# Patient Record
Sex: Male | Born: 1935
Health system: Southern US, Community
[De-identification: ages and names within clinical notes are randomized; demographics above are authoritative.]

## PROBLEM LIST (undated history)

## (undated) DIAGNOSIS — D689 Coagulation defect, unspecified: Secondary | ICD-10-CM

## (undated) DIAGNOSIS — E7800 Pure hypercholesterolemia, unspecified: Secondary | ICD-10-CM

## (undated) DIAGNOSIS — D649 Anemia, unspecified: Secondary | ICD-10-CM

## (undated) DIAGNOSIS — E559 Vitamin D deficiency, unspecified: Secondary | ICD-10-CM

## (undated) DIAGNOSIS — G459 Transient cerebral ischemic attack, unspecified: Secondary | ICD-10-CM

## (undated) DIAGNOSIS — D509 Iron deficiency anemia, unspecified: Secondary | ICD-10-CM

## (undated) DIAGNOSIS — N400 Enlarged prostate without lower urinary tract symptoms: Secondary | ICD-10-CM

## (undated) DIAGNOSIS — I480 Paroxysmal atrial fibrillation: Secondary | ICD-10-CM

## (undated) DIAGNOSIS — K219 Gastro-esophageal reflux disease without esophagitis: Secondary | ICD-10-CM

## (undated) HISTORY — DX: Iron deficiency anemia, unspecified: D50.9

## (undated) HISTORY — DX: Anemia, unspecified: D64.9

## (undated) HISTORY — DX: Coagulation defect, unspecified: D68.9

## (undated) HISTORY — DX: Gastro-esophageal reflux disease without esophagitis: K21.9

---

## 2011-04-03 DIAGNOSIS — I639 Cerebral infarction, unspecified: Secondary | ICD-10-CM

## 2011-04-03 HISTORY — DX: Cerebral infarction, unspecified: I63.9

## 2017-09-25 DIAGNOSIS — D61818 Other pancytopenia: Secondary | ICD-10-CM | POA: Diagnosis not present

## 2017-09-25 DIAGNOSIS — Z8673 Personal history of transient ischemic attack (TIA), and cerebral infarction without residual deficits: Secondary | ICD-10-CM | POA: Diagnosis not present

## 2017-09-25 DIAGNOSIS — E559 Vitamin D deficiency, unspecified: Secondary | ICD-10-CM | POA: Diagnosis not present

## 2017-09-25 DIAGNOSIS — I480 Paroxysmal atrial fibrillation: Secondary | ICD-10-CM | POA: Diagnosis not present

## 2017-09-25 DIAGNOSIS — N401 Enlarged prostate with lower urinary tract symptoms: Secondary | ICD-10-CM | POA: Diagnosis not present

## 2017-09-25 DIAGNOSIS — E7800 Pure hypercholesterolemia, unspecified: Secondary | ICD-10-CM | POA: Diagnosis not present

## 2017-11-07 DIAGNOSIS — E7800 Pure hypercholesterolemia, unspecified: Secondary | ICD-10-CM | POA: Diagnosis not present

## 2017-11-07 DIAGNOSIS — N401 Enlarged prostate with lower urinary tract symptoms: Secondary | ICD-10-CM | POA: Diagnosis not present

## 2017-11-07 DIAGNOSIS — Z8673 Personal history of transient ischemic attack (TIA), and cerebral infarction without residual deficits: Secondary | ICD-10-CM | POA: Diagnosis not present

## 2017-11-07 DIAGNOSIS — E559 Vitamin D deficiency, unspecified: Secondary | ICD-10-CM | POA: Diagnosis not present

## 2017-11-07 DIAGNOSIS — I480 Paroxysmal atrial fibrillation: Secondary | ICD-10-CM | POA: Diagnosis not present

## 2017-11-07 DIAGNOSIS — D508 Other iron deficiency anemias: Secondary | ICD-10-CM | POA: Diagnosis not present

## 2017-12-26 ENCOUNTER — Encounter (HOSPITAL_COMMUNITY): Payer: Self-pay | Admitting: Internal Medicine

## 2017-12-26 ENCOUNTER — Inpatient Hospital Stay (HOSPITAL_COMMUNITY): Payer: Medicare HMO

## 2017-12-26 ENCOUNTER — Inpatient Hospital Stay (HOSPITAL_COMMUNITY): Payer: Medicare HMO | Attending: Internal Medicine | Admitting: Internal Medicine

## 2017-12-26 VITALS — BP 125/74 | HR 103 | Temp 97.0°F | Resp 16 | Ht 67.0 in | Wt 156.9 lb

## 2017-12-26 DIAGNOSIS — Z7901 Long term (current) use of anticoagulants: Secondary | ICD-10-CM | POA: Diagnosis not present

## 2017-12-26 DIAGNOSIS — R5383 Other fatigue: Secondary | ICD-10-CM | POA: Diagnosis not present

## 2017-12-26 DIAGNOSIS — D508 Other iron deficiency anemias: Secondary | ICD-10-CM

## 2017-12-26 DIAGNOSIS — D72819 Decreased white blood cell count, unspecified: Secondary | ICD-10-CM

## 2017-12-26 DIAGNOSIS — D509 Iron deficiency anemia, unspecified: Secondary | ICD-10-CM | POA: Diagnosis not present

## 2017-12-26 DIAGNOSIS — Z8673 Personal history of transient ischemic attack (TIA), and cerebral infarction without residual deficits: Secondary | ICD-10-CM

## 2017-12-26 LAB — COMPREHENSIVE METABOLIC PANEL
ALT: 9 U/L (ref 0–44)
AST: 15 U/L (ref 15–41)
Albumin: 3.6 g/dL (ref 3.5–5.0)
Alkaline Phosphatase: 50 U/L (ref 38–126)
Anion gap: 6 (ref 5–15)
BILIRUBIN TOTAL: 0.5 mg/dL (ref 0.3–1.2)
BUN: 23 mg/dL (ref 8–23)
CALCIUM: 8.8 mg/dL — AB (ref 8.9–10.3)
CO2: 26 mmol/L (ref 22–32)
CREATININE: 0.89 mg/dL (ref 0.61–1.24)
Chloride: 106 mmol/L (ref 98–111)
GFR calc Af Amer: >60 mL/min (ref >60)
GFR calc non Af Amer: >60 mL/min (ref >60)
Glucose, Bld: 98 mg/dL (ref 70–99)
Potassium: 4.2 mmol/L (ref 3.5–5.1)
Sodium: 138 mmol/L (ref 135–145)
TOTAL PROTEIN: 6.7 g/dL (ref 6.5–8.1)

## 2017-12-26 LAB — CBC WITH DIFFERENTIAL/PLATELET
BASOS ABS: 0 10*3/uL (ref 0.0–0.1)
Basophils Relative: 0 %
Eosinophils Absolute: 0 10*3/uL (ref 0.0–0.7)
Eosinophils Relative: 1 %
HEMATOCRIT: 25.4 % — AB (ref 39.0–52.0)
Hemoglobin: 7.8 g/dL — ABNORMAL LOW (ref 13.0–17.0)
LYMPHS PCT: 25 %
Lymphs Abs: 0.8 10*3/uL (ref 0.7–4.0)
MCH: 24.5 pg — ABNORMAL LOW (ref 26.0–34.0)
MCHC: 30.7 g/dL (ref 30.0–36.0)
MCV: 79.9 fL (ref 78.0–100.0)
MONO ABS: 0.4 10*3/uL (ref 0.1–1.0)
Monocytes Relative: 15 %
NEUTROS ABS: 1.8 10*3/uL (ref 1.7–7.7)
Neutrophils Relative %: 59 %
Platelets: 157 10*3/uL (ref 150–400)
RBC: 3.18 MIL/uL — AB (ref 4.22–5.81)
RDW: 17.6 % — ABNORMAL HIGH (ref 11.5–15.5)
WBC: 3 10*3/uL — ABNORMAL LOW (ref 4.0–10.5)

## 2017-12-26 LAB — FOLATE: FOLATE: 12.5 ng/mL (ref >5.9)

## 2017-12-26 LAB — LACTATE DEHYDROGENASE: LDH: 164 U/L (ref 98–192)

## 2017-12-26 LAB — VITAMIN B12: Vitamin B-12: 246 pg/mL (ref 180–914)

## 2017-12-26 LAB — FERRITIN
FERRITIN: 5 ng/mL — AB (ref 24–336)
Ferritin: 5 ng/mL — ABNORMAL LOW (ref 24–336)

## 2017-12-26 NOTE — Patient Instructions (Signed)
[facility] Discharge Instructions  You saw Dr. Higgs today.   Thank you for choosing [facility] at [facility] to provide your oncology and hematology care.  To afford each patient quality time with our provider, please arrive at least 15 minutes before your scheduled appointment time.   If you have a lab appointment with the Cancer Center please come in thru the  Main Entrance and check in at the main information desk  You need to re-schedule your appointment should you arrive 10 or more minutes late.  We strive to give you quality time with our providers, and arriving late affects you and other patients whose appointments are after yours.  Also, if you no show three or more times for appointments you may be dismissed from the clinic at the providers discretion.     Again, thank you for choosing [facility].  Our hope is that these requests will decrease the amount of time that you wait before being seen by our physicians.       _____________________________________________________________  Should you have questions after your visit to [facility], please contact our office at [phone] between the hours of 8:00 a.m. and 4:30 p.m.  Voicemails left after 4:00 p.m. will not be returned until the following business day.  For prescription refill requests, have your pharmacy contact our office and allow 72 hours.    Cancer Center Support Programs:   > Cancer Support Group  2nd Tuesday of the month 1pm-2pm, Journey Room    

## 2017-12-26 NOTE — Progress Notes (Signed)
Referring Physician:  Vidal Schwalbe, MD  [facility]  Diagnosis Other iron deficiency anemia - Plan: CBC with Differential/Platelet, Comprehensive metabolic panel, Lactate dehydrogenase, Protein electrophoresis, serum, Ferritin, Hemoglobinopathy evaluation, Vitamin B12, Folate, Haptoglobin, CBC with Differential/Platelet, Ferritin, CANCELED: Comprehensive metabolic panel, CANCELED: Lactate dehydrogenase  Staging Cancer Staging No matching staging information was found for the patient.  Assessment and Plan:  1.  Iron deficiency anemia.  Labs performed today 12/26/2017 reviewed and showed a white count of 3 hemoglobin 7.8 hematocrit 25.4 MCV 79.9 platelets 157 iron is decreased at 5 B12 and folate are normal.  Chemistries within normal limits with a potassium of 4.2 creatinine 0.89 normal liver function tests.  SPEP, hemoglobin electrophoresis, haptoglobin are pending.  I discussed with the patient and his wife via phone that he was showing evidence of anemia with a hemoglobin of 7.8 and an iron level that was decreased at 5.  They were given option of blood transfusion or a trial of intravenous iron.  Patient and wife reports they do not desire blood transfusion or iron therapy.  They will try diet.  I have explained to them his blood counts are low and this could contribute to issues of shortness of breath, chest pain, worsening fatigue.  They are advised if he develops any symptoms he should report to the emergency room for evaluation.  Will determine if he has undergone GI evaluation in the past.  The patient will return to clinic in October 2019 for follow-up and repeat labs.  2.  Leukopenia.  Pt has normal differential.  Likely normal variant.  Will repeat labs on RTC.    3.  Stroke.  Patient reports this occurred in 2013 in 2015.  He is currently on Eliquis.  4.  Fatigue.  This is likely due to Iron deficiency anemia.  Pt was given option of blood transfusion or IV iron.  They desire  to try Diet only.  I have discussed with them potential risk for chest pain, SOB, worsening fatigue due to degree of anemia and IDA.   They are advised to report to ER if symptoms worsen.     Greater than 40 minutes spent with more than 50% spent in counseling and coordination of care.    HPI: 84 year old Adams referred for evaluation of anemia.  He reports he was tried on oral iron but had a reaction to the medications feeling dizzy and weak.  He has a history of a stroke in 2013 and 2015.  He denies any blood in his stool or his urine.  He has previously undergone colonoscopy evaluation.  Pt and Wife reports she would prefer for the patient to be treated with diet to improve his counts.  Patient is seen today for consultation due to anemia.  No labs available for review.  Problem List There are no active problems to display for this patient.   Past Medical History Past Medical History:  Diagnosis Date  . Anemia   . Clotting disorder ([place])   . GERD (gastroesophageal reflux disease)     Past Surgical History History reviewed. No pertinent surgical history.  Family History History reviewed. No pertinent family history.   Social History  reports that he has never smoked. He has never used smokeless tobacco. He reports that he does not drink alcohol or use drugs.  Medications  Current Outpatient Medications:  .  acetaminophen (TYLENOL) 500 MG tablet, Take 500 mg by mouth as needed for moderate pain., Disp: , Rfl:  .  Cholecalciferol (VITAMIN D3) 2000 units TABS, Take 1 tablet by mouth daily., Disp: , Rfl:  .  ELIQUIS 5 MG TABS tablet, , Disp: , Rfl:  .  ranitidine (ZANTAC) 150 MG tablet, , Disp: , Rfl:  .  tamsulosin (FLOMAX) 0.4 MG CAPS capsule, , Disp: , Rfl:   Allergies Patient has no known allergies.  Review of Systems Review of Systems - Oncology ROS negative other than weakness.     Physical Exam  Vitals Wt Readings from Last 3 Encounters:  12/26/17 156 lb 14.4 oz  (71.2 kg)   Temp Readings from Last 3 Encounters:  12/26/17 (!) 97 F (36.1 C) (Oral)   BP Readings from Last 3 Encounters:  12/26/17 125/74   Pulse Readings from Last 3 Encounters:  12/26/17 (!) 103   Constitutional: Well-developed, well-nourished, and in no distress.   HENT: Head: Normocephalic and atraumatic.  Mouth/Throat: No oropharyngeal exudate. Mucosa moist. Eyes: Pupils are equal, round, and reactive to light. Conjunctivae are normal. No scleral icterus.  Neck: Normal range of motion. Neck supple. No JVD present.  Cardiovascular: Normal rate, regular rhythm and normal heart sounds.  Exam reveals no gallop and no friction rub.   No murmur heard. Pulmonary/Chest: Effort normal and breath sounds normal. No respiratory distress. No wheezes.No rales.  Abdominal: Soft. Bowel sounds are normal. No distension. There is no tenderness. There is no guarding.  Musculoskeletal: No edema or tenderness.  Lymphadenopathy: No cervical, axillary or supraclavicular adenopathy.  Neurological: Alert and oriented to person, place, and time. No cranial nerve deficit.  Skin: Skin is warm and dry. No rash noted. No erythema. No pallor.  Psychiatric: Affect and judgment normal.   Labs Appointment on 12/26/2017  Component Date Value Ref Range Status  . WBC 12/26/2017 3.0* 4.0 - 10.5 K/uL Final  . RBC 12/26/2017 3.18* 4.22 - 5.81 MIL/uL Final  . Hemoglobin 12/26/2017 7.8* Nathan.0 - 17.0 g/dL Final  . HCT 12/26/2017 25.4* 39.0 - 52.0 % Final  . MCV 12/26/2017 79.9  78.0 - 100.0 fL Final  . MCH 12/26/2017 24.5* 26.0 - 34.0 pg Final  . MCHC 12/26/2017 30.7  30.0 - 36.0 g/dL Final  . RDW 12/26/2017 17.6* 11.5 - 15.5 % Final  . Platelets 12/26/2017 157  150 - 400 K/uL Final  . Neutrophils Relative % 12/26/2017 59  % Final  . Neutro Abs 12/26/2017 1.8  1.7 - 7.7 K/uL Final  . Lymphocytes Relative 12/26/2017 25  % Final  . Lymphs Abs 12/26/2017 0.8  0.7 - 4.0 K/uL Final  . Monocytes Relative 12/26/2017  15  % Final  . Monocytes Absolute 12/26/2017 0.4  0.1 - 1.0 K/uL Final  . Eosinophils Relative 12/26/2017 1  % Final  . Eosinophils Absolute 12/26/2017 0.0  0.0 - 0.7 K/uL Final  . Basophils Relative 12/26/2017 0  % Final  . Basophils Absolute 12/26/2017 0.0  0.0 - 0.1 K/uL Final   Performed at [facility], [address] [place], [place] [address]  . Sodium 12/26/2017 138  135 - 145 mmol/L Final  . Potassium 12/26/2017 4.2  3.5 - 5.1 mmol/L Final  . Chloride 12/26/2017 106  98 - 111 mmol/L Final  . CO2 12/26/2017 26  22 - 32 mmol/L Final  . Glucose, Bld 12/26/2017 98  70 - 99 mg/dL Final  . BUN 12/26/2017 23  8 - 23 mg/dL Final  . Creatinine, Ser 12/26/2017 0.89  0.61 - 1.24 mg/dL Final  . Calcium 12/26/2017 8.8* 8.9 - 10.3  mg/dL Final  . Total Protein 12/26/2017 6.7  6.5 - 8.1 g/dL Final  . Albumin 12/26/2017 3.6  3.5 - 5.0 g/dL Final  . AST 12/26/2017 15  15 - 41 U/L Final  . ALT 12/26/2017 9  0 - 44 U/L Final  . Alkaline Phosphatase 12/26/2017 50  38 - 126 U/L Final  . Total Bilirubin 12/26/2017 0.5  0.3 - 1.2 mg/dL Final  . GFR calc non Af Amer 12/26/2017 >60  >60 mL/min Final  . GFR calc Af Amer 12/26/2017 >60  >60 mL/min Final   Comment: (NOTE) The eGFR has been calculated using the CKD EPI equation. This calculation has not been validated in all clinical situations. eGFR's persistently <60 mL/min signify possible Chronic Kidney Disease.   Georgiann Hahn gap 12/26/2017 6  5 - 15 Final   Performed at [facility] [place], [address] [place], [place] [address]  . LDH 12/26/2017 164  98 - 192 U/L Final   Performed at [facility], [address], [place], [place] [address]  . Ferritin 12/26/2017 5* 24 - 336 ng/mL Final   Performed at [facility], [address] [place], [place] [address]  . Vitamin B-12 12/26/2017 246  180 - 914 pg/mL Final   Comment: (NOTE) This assay is not validated for testing neonatal or myeloproliferative syndrome specimens for Vitamin B12 levels. Performed at  [facility], [address] [place], [place] [address]   . Folate 12/26/2017 12.5  >5.9 ng/mL Final   Performed at [facility], [address], [place], [place] [address]  . Ferritin 12/26/2017 5* 24 - 336 ng/mL Final   Performed at [facility], [address] [place],  [address]     Pathology Orders Placed This Encounter  Procedures  . CBC with Differential/Platelet    Standing Status:   Future    Number of Occurrences:   1    Standing Expiration Date:   12/27/2018  . Comprehensive metabolic panel    Standing Status:   Future    Number of Occurrences:   1    Standing Expiration Date:   12/27/2018  . Lactate dehydrogenase    Standing Status:   Future    Number of Occurrences:   1    Standing Expiration Date:   12/27/2018  . Protein electrophoresis, serum    Standing Status:   Future    Number of Occurrences:   1    Standing Expiration Date:   12/27/2018  . Ferritin    Standing Status:   Future    Number of Occurrences:   1    Standing Expiration Date:   12/27/2018  . Hemoglobinopathy evaluation    Standing Status:   Future    Number of Occurrences:   1    Standing Expiration Date:   12/27/2018  . Vitamin B12    Standing Status:   Future    Number of Occurrences:   1    Standing Expiration Date:   12/27/2018  . Folate    Standing Status:   Future    Number of Occurrences:   1    Standing Expiration Date:   12/27/2018  . Haptoglobin    Standing Status:   Future    Number of Occurrences:   1    Standing Expiration Date:   12/27/2018  . CBC with Differential/Platelet    Standing Status:   Future    Number of Occurrences:   1    Standing Expiration Date:  12/27/2018  . Ferritin    Standing Status:   Future    Number of Occurrences:   1    Standing Expiration Date:   12/27/2018       Zoila Shutter MD

## 2017-12-27 LAB — HAPTOGLOBIN: HAPTOGLOBIN: 150 mg/dL (ref 34–200)

## 2017-12-30 LAB — HEMOGLOBINOPATHY EVALUATION
Hgb A2 Quant: 1.5 % — ABNORMAL LOW (ref 1.8–3.2)
Hgb A: 98.5 % (ref 96.4–98.8)
Hgb C: 0 %
Hgb F Quant: 0 % (ref 0.0–2.0)
Hgb S Quant: 0 %
Hgb Variant: 0 %

## 2017-12-30 LAB — PROTEIN ELECTROPHORESIS, SERUM
A/G RATIO SPE: 1.3 (ref 0.7–1.7)
ALBUMIN ELP: 3.6 g/dL (ref 2.9–4.4)
ALPHA-2-GLOBULIN: 0.6 g/dL (ref 0.4–1.0)
Alpha-1-Globulin: 0.2 g/dL (ref 0.0–0.4)
Beta Globulin: 0.8 g/dL (ref 0.7–1.3)
GLOBULIN, TOTAL: 2.7 g/dL (ref 2.2–3.9)
Gamma Globulin: 1 g/dL (ref 0.4–1.8)
Total Protein ELP: 6.3 g/dL (ref 6.0–8.5)

## 2018-01-14 ENCOUNTER — Other Ambulatory Visit (HOSPITAL_COMMUNITY): Payer: Self-pay | Admitting: *Deleted

## 2018-01-14 DIAGNOSIS — D508 Other iron deficiency anemias: Secondary | ICD-10-CM

## 2018-01-15 ENCOUNTER — Inpatient Hospital Stay (HOSPITAL_COMMUNITY): Payer: Medicare HMO | Attending: Hematology

## 2018-01-15 DIAGNOSIS — D509 Iron deficiency anemia, unspecified: Secondary | ICD-10-CM | POA: Insufficient documentation

## 2018-01-15 DIAGNOSIS — D72819 Decreased white blood cell count, unspecified: Secondary | ICD-10-CM | POA: Insufficient documentation

## 2018-01-15 DIAGNOSIS — R5383 Other fatigue: Secondary | ICD-10-CM | POA: Insufficient documentation

## 2018-01-15 DIAGNOSIS — D508 Other iron deficiency anemias: Secondary | ICD-10-CM

## 2018-01-15 DIAGNOSIS — Z8673 Personal history of transient ischemic attack (TIA), and cerebral infarction without residual deficits: Secondary | ICD-10-CM | POA: Diagnosis not present

## 2018-01-15 DIAGNOSIS — Z7901 Long term (current) use of anticoagulants: Secondary | ICD-10-CM | POA: Insufficient documentation

## 2018-01-15 LAB — CBC WITH DIFFERENTIAL/PLATELET
Abs Immature Granulocytes: 0.01 10*3/uL (ref 0.00–0.07)
BASOS ABS: 0 10*3/uL (ref 0.0–0.1)
Basophils Relative: 1 %
Eosinophils Absolute: 0 10*3/uL (ref 0.0–0.5)
Eosinophils Relative: 1 %
HCT: 27 % — ABNORMAL LOW (ref 39.0–52.0)
Hemoglobin: 8 g/dL — ABNORMAL LOW (ref 13.0–17.0)
Immature Granulocytes: 0 %
Lymphocytes Relative: 23 %
Lymphs Abs: 0.8 10*3/uL (ref 0.7–4.0)
MCH: 24 pg — AB (ref 26.0–34.0)
MCHC: 29.6 g/dL — ABNORMAL LOW (ref 30.0–36.0)
MCV: 81.1 fL (ref 80.0–100.0)
MONO ABS: 0.4 10*3/uL (ref 0.1–1.0)
Monocytes Relative: 11 %
Neutro Abs: 2.1 10*3/uL (ref 1.7–7.7)
Neutrophils Relative %: 64 %
Platelets: 150 10*3/uL (ref 150–400)
RBC: 3.33 MIL/uL — AB (ref 4.22–5.81)
RDW: 17.7 % — ABNORMAL HIGH (ref 11.5–15.5)
WBC: 3.4 10*3/uL — AB (ref 4.0–10.5)
nRBC: 0 % (ref 0.0–0.2)

## 2018-01-15 LAB — LACTATE DEHYDROGENASE: LDH: 164 U/L (ref 98–192)

## 2018-01-15 LAB — COMPREHENSIVE METABOLIC PANEL
ALBUMIN: 3.8 g/dL (ref 3.5–5.0)
ALK PHOS: 53 U/L (ref 38–126)
ALT: 11 U/L (ref 0–44)
AST: 16 U/L (ref 15–41)
Anion gap: 6 (ref 5–15)
BUN: 22 mg/dL (ref 8–23)
CALCIUM: 8.9 mg/dL (ref 8.9–10.3)
CO2: 25 mmol/L (ref 22–32)
CREATININE: 1 mg/dL (ref 0.61–1.24)
Chloride: 107 mmol/L (ref 98–111)
GFR calc Af Amer: >60 mL/min (ref >60)
GFR calc non Af Amer: >60 mL/min (ref >60)
GLUCOSE: 97 mg/dL (ref 70–99)
Potassium: 4 mmol/L (ref 3.5–5.1)
SODIUM: 138 mmol/L (ref 135–145)
Total Bilirubin: 0.4 mg/dL (ref 0.3–1.2)
Total Protein: 6.7 g/dL (ref 6.5–8.1)

## 2018-01-15 LAB — IRON AND TIBC
Iron: 20 ug/dL — ABNORMAL LOW (ref 45–182)
Saturation Ratios: 6 % — ABNORMAL LOW (ref 17.9–39.5)
TIBC: 356 ug/dL (ref 250–450)
UIBC: 336 ug/dL

## 2018-01-15 LAB — VITAMIN B12: VITAMIN B 12: 306 pg/mL (ref 180–914)

## 2018-01-15 LAB — FERRITIN: FERRITIN: 6 ng/mL — AB (ref 24–336)

## 2018-01-15 LAB — FOLATE: FOLATE: 18.6 ng/mL (ref >5.9)

## 2018-01-22 ENCOUNTER — Encounter (HOSPITAL_COMMUNITY): Payer: Self-pay | Admitting: Internal Medicine

## 2018-01-22 ENCOUNTER — Inpatient Hospital Stay (HOSPITAL_BASED_OUTPATIENT_CLINIC_OR_DEPARTMENT_OTHER): Payer: Medicare HMO | Admitting: Internal Medicine

## 2018-01-22 ENCOUNTER — Other Ambulatory Visit: Payer: Self-pay

## 2018-01-22 DIAGNOSIS — R5383 Other fatigue: Secondary | ICD-10-CM | POA: Diagnosis not present

## 2018-01-22 DIAGNOSIS — Z7901 Long term (current) use of anticoagulants: Secondary | ICD-10-CM

## 2018-01-22 DIAGNOSIS — D509 Iron deficiency anemia, unspecified: Secondary | ICD-10-CM | POA: Diagnosis not present

## 2018-01-22 DIAGNOSIS — Z8673 Personal history of transient ischemic attack (TIA), and cerebral infarction without residual deficits: Secondary | ICD-10-CM | POA: Diagnosis not present

## 2018-01-22 DIAGNOSIS — D72819 Decreased white blood cell count, unspecified: Secondary | ICD-10-CM | POA: Diagnosis not present

## 2018-01-22 DIAGNOSIS — D508 Other iron deficiency anemias: Secondary | ICD-10-CM

## 2018-01-22 HISTORY — DX: Iron deficiency anemia, unspecified: D50.9

## 2018-01-22 NOTE — Progress Notes (Signed)
Diagnosis Other iron deficiency anemia - Plan: CBC with Differential/Platelet, Comprehensive metabolic panel, Lactate dehydrogenase, Ferritin  Staging Cancer Staging No matching staging information was found for the patient.  Assessment and Plan:  1.  Iron deficiency anemia.  Labs performed 12/26/2017 reviewed and showed a white count of 3 hemoglobin 7.8 hematocrit 25.4 MCV 79.9 platelets 157 iron is decreased at 5 B12 and folate are normal.  Chemistries within normal limits with a potassium of 4.2 creatinine 0.89 normal liver function tests.  SPEP, hemoglobin electrophoresis, haptoglobin are pending.  Previously, I discussed with the patient and his wife via phone that he was showing evidence of anemia with a hemoglobin of 7.8 and an iron level that was decreased at 5.  They were given option of blood transfusion or a trial of intravenous iron.  Patient and wife reports they do not desire blood transfusion or iron therapy.  They will try diet.  I have explained to them his blood counts are low and this could contribute to issues of shortness of breath, chest pain, worsening fatigue.  They are advised if he develops any symptoms he should report to the emergency room for evaluation.    Labs done 01/15/2018 reviewed and showed WBC 3.4 HB 8 plts 150,000.  Chemistries WNL with K+ 4, Cr 1 and normal LFTS.  Ferritin remains decreased at 6.   Long talk with pt and wife today. Pt has an intolerance to oral iron.   I discussed with them numbers have not improved since last visit. I discussed with them option of Injectafer 750 mg IV D1 and D8.  Side effects of medication reviewed.  Wife was questioning if IV iron was a blood product.  I discussed with her Christeen Douglas is a medication.  Pt is agreeable to iron infusion.  He will RTC in 4-6 weeks for follow-up and repeat labs after IV iron.  Will determine if he has undergone GI evaluation in the past.    2.  Leukopenia.  WBC 3.4.  Pt has normal differential.   Likely normal variant.  Will repeat labs 4-6 weeks after IV iron.    3.  Stroke.  Patient reports this occurred in 2013 in 2015.  He is currently on Eliquis.  4.  Fatigue.  This is likely due to Iron deficiency anemia.  Pt was given option of blood transfusion or IV iron.  They initially desired to try Diet only.  I have discussed with them potential risk for chest pain, SOB, worsening fatigue due to degree of anemia and IDA.    Pt is now willing to receive IV iron.  Will determine if symptoms improve after IV iron.    5.  Request for prescription refills. Pt is followed by Dr. Marina Gravel and should follow-up with his office for medications. We are happy to contact their office for pt.    25 minutes spent with more than 50% spent in counseling and coordination of care.    Current Status:  Pt is seen today for follow-up.  Pt has been taking beet supplement.  Pt is here to go over labs.    Problem List Patient Active Problem List   Diagnosis Date Noted  . Iron deficiency anemia [D50.9] 01/22/2018    Past Medical History Past Medical History:  Diagnosis Date  . Anemia   . Clotting disorder ([place])   . GERD (gastroesophageal reflux disease)   . Iron deficiency anemia 01/22/2018    Past Surgical History History reviewed. No pertinent surgical  history.  Family History History reviewed. No pertinent family history.   Social History  reports that he has never smoked. He has never used smokeless tobacco. He reports that he does not drink alcohol or use drugs.  Medications  Current Outpatient Medications:  .  acetaminophen (TYLENOL) 500 MG tablet, Take 500 mg by mouth as needed for moderate pain., Disp: , Rfl:  .  Cholecalciferol (VITAMIN D3) 2000 units TABS, Take 1 tablet by mouth daily., Disp: , Rfl:  .  ELIQUIS 5 MG TABS tablet, , Disp: , Rfl:  .  OVER THE COUNTER MEDICATION, Take 1,000 mg by mouth 2 (two) times daily with a meal., Disp: , Rfl:  .  ranitidine (ZANTAC) 150 MG tablet,  , Disp: , Rfl:  .  tamsulosin (FLOMAX) 0.4 MG CAPS capsule, , Disp: , Rfl:   Allergies Patient has no known allergies.  Review of Systems Review of Systems - Oncology ROS negative other than fatigue   Physical Exam  Vitals Wt Readings from Last 3 Encounters:  12/26/17 156 lb 14.4 oz (71.2 kg)   Temp Readings from Last 3 Encounters:  01/22/18 (!) 97.5 F (36.4 C) (Oral)  12/26/17 (!) 97 F (36.1 C) (Oral)   BP Readings from Last 3 Encounters:  01/22/18 (!) 129/55  12/26/17 125/74   Pulse Readings from Last 3 Encounters:  01/22/18 (!) 54  12/26/17 (!) 103   Constitutional: Well-developed, well-nourished, and in no distress.   HENT: Head: Normocephalic and atraumatic.  Mouth/Throat: No oropharyngeal exudate. Mucosa moist. Eyes: Pupils are equal, round, and reactive to light. Conjunctivae are normal. No scleral icterus.  Neck: Normal range of motion. Neck supple. No JVD present.  Cardiovascular: Normal rate, regular rhythm and normal heart sounds.  Exam reveals no gallop and no friction rub.   No murmur heard. Pulmonary/Chest: Effort normal and breath sounds normal. No respiratory distress. No wheezes.No rales.  Abdominal: Soft. Bowel sounds are normal. No distension. There is no tenderness. There is no guarding.  Musculoskeletal: No edema or tenderness.  Lymphadenopathy: No cervical, axillary  or supraclavicular adenopathy.  Neurological: Alert and oriented to person, place, and time. No cranial nerve deficit.  Skin: Skin is warm and dry. No rash noted. No erythema. No pallor.  Psychiatric: Affect and judgment normal.   Labs No visits with results within 3 Day(s) from this visit.  Latest known visit with results is:  Appointment on 01/15/2018  Component Date Value Ref Range Status  . WBC 01/15/2018 3.4* 4.0 - 10.5 K/uL Final  . RBC 01/15/2018 3.33* 4.22 - 5.81 MIL/uL Final  . Hemoglobin 01/15/2018 8.0* 13.0 - 17.0 g/dL Final  . HCT 01/15/2018 27.0* 39.0 - 52.0 %  Final  . MCV 01/15/2018 81.1  80.0 - 100.0 fL Final  . MCH 01/15/2018 24.0* 26.0 - 34.0 pg Final  . MCHC 01/15/2018 29.6* 30.0 - 36.0 g/dL Final  . RDW 01/15/2018 17.7* 11.5 - 15.5 % Final  . Platelets 01/15/2018 150  150 - 400 K/uL Final  . nRBC 01/15/2018 0.0  0.0 - 0.2 % Final  . Neutrophils Relative % 01/15/2018 64  % Final  . Neutro Abs 01/15/2018 2.1  1.7 - 7.7 K/uL Final  . Lymphocytes Relative 01/15/2018 23  % Final  . Lymphs Abs 01/15/2018 0.8  0.7 - 4.0 K/uL Final  . Monocytes Relative 01/15/2018 11  % Final  . Monocytes Absolute 01/15/2018 0.4  0.1 - 1.0 K/uL Final  . Eosinophils Relative 01/15/2018 1  % Final  .  Eosinophils Absolute 01/15/2018 0.0  0.0 - 0.5 K/uL Final  . Basophils Relative 01/15/2018 1  % Final  . Basophils Absolute 01/15/2018 0.0  0.0 - 0.1 K/uL Final  . Immature Granulocytes 01/15/2018 0  % Final  . Abs Immature Granulocytes 01/15/2018 0.01  0.00 - 0.07 K/uL Final   Performed at [facility], [address] [place], [place] [address]  . Sodium 01/15/2018 138  135 - 145 mmol/L Final  . Potassium 01/15/2018 4.0  3.5 - 5.1 mmol/L Final  . Chloride 01/15/2018 107  98 - 111 mmol/L Final  . CO2 01/15/2018 25  22 - 32 mmol/L Final  . Glucose, Bld 01/15/2018 97  70 - 99 mg/dL Final  . BUN 01/15/2018 22  8 - 23 mg/dL Final  . Creatinine, Ser 01/15/2018 1.00  0.61 - 1.24 mg/dL Final  . Calcium 01/15/2018 8.9  8.9 - 10.3 mg/dL Final  . Total Protein 01/15/2018 6.7  6.5 - 8.1 g/dL Final  . Albumin 01/15/2018 3.8  3.5 - 5.0 g/dL Final  . AST 01/15/2018 16  15 - 41 U/L Final  . ALT 01/15/2018 11  0 - 44 U/L Final  . Alkaline Phosphatase 01/15/2018 53  38 - 126 U/L Final  . Total Bilirubin 01/15/2018 0.4  0.3 - 1.2 mg/dL Final  . GFR calc non Af Amer 01/15/2018 >60  >60 mL/min Final  . GFR calc Af Amer 01/15/2018 >60  >60 mL/min Final   Comment: (NOTE) The eGFR has been calculated using the CKD EPI equation. This calculation has not been validated in all  clinical situations. eGFR's persistently <60 mL/min signify possible Chronic Kidney Disease.   Georgiann Hahn gap 01/15/2018 6  5 - 15 Final   Performed at [facility], [address] [place], [place] [address]  . LDH 01/15/2018 164  98 - 192 U/L Final   Performed at [facility], [address], [place], [place] [address]  . Iron 01/15/2018 20* 45 - 182 ug/dL Final  . TIBC 01/15/2018 356  250 - 450 ug/dL Final  . Saturation Ratios 01/15/2018 6* 17.9 - 39.5 % Final  . UIBC 01/15/2018 336  ug/dL Final   Performed at [facility], [address], [place], [place] [address]  . Ferritin 01/15/2018 6* 24 - 336 ng/mL Final   Performed at [facility], [address] [place], [place] [address]  . Vitamin B-12 01/15/2018 306  180 - 914 pg/mL Final   Comment: (NOTE) This assay is not validated for testing neonatal or myeloproliferative syndrome specimens for Vitamin B12 levels. Performed at [facility], [address], [place], [place] [address]   . Folate 01/15/2018 18.6  >5.9 ng/mL Final   Performed at [facility], [address], [place], [place] [address]     Pathology Orders Placed This Encounter  Procedures  . CBC with Differential/Platelet    Standing Status:   Future    Standing Expiration Date:   01/23/2019  . Comprehensive metabolic panel    Standing Status:   Future    Standing Expiration Date:   01/23/2019  . Lactate dehydrogenase    Standing Status:   Future    Standing Expiration Date:   01/23/2019  . Ferritin    Standing Status:   Future    Standing Expiration Date:   01/23/2019       Zoila Shutter MD

## 2018-01-27 ENCOUNTER — Encounter (HOSPITAL_COMMUNITY): Payer: Self-pay

## 2018-01-27 ENCOUNTER — Inpatient Hospital Stay (HOSPITAL_COMMUNITY): Payer: Medicare HMO

## 2018-01-27 VITALS — BP 149/50 | HR 48 | Temp 97.6°F | Resp 18

## 2018-01-27 DIAGNOSIS — D508 Other iron deficiency anemias: Secondary | ICD-10-CM

## 2018-01-27 DIAGNOSIS — D509 Iron deficiency anemia, unspecified: Secondary | ICD-10-CM | POA: Diagnosis not present

## 2018-01-27 MED ORDER — SODIUM CHLORIDE 0.9 % IV SOLN
750.0000 mg | Freq: Once | INTRAVENOUS | Status: AC
  Administered 2018-01-27: 750 mg via INTRAVENOUS
  Filled 2018-01-27: qty 15

## 2018-01-27 MED ORDER — SODIUM CHLORIDE 0.9 % IV SOLN
Freq: Once | INTRAVENOUS | Status: AC
  Administered 2018-01-27: 13:00:00 via INTRAVENOUS

## 2018-01-27 NOTE — Patient Instructions (Signed)

## 2018-01-27 NOTE — Progress Notes (Signed)
Patient for iron treatment today.  Reviewed iron infusion with written information given with all questions asked and answered.  Family at side.    Patient tolerated iron infusion with no complaints voiced.  Good blood return noted before and after administration of iron.  Peripheral IV site clean and dry with no bruising or swelling noted at site.  Band aid applied.  VSS with discharge and left by wheelchair with no s/s of distress noted.

## 2018-02-03 ENCOUNTER — Ambulatory Visit (HOSPITAL_COMMUNITY): Payer: Medicare HMO

## 2018-02-04 ENCOUNTER — Encounter (HOSPITAL_COMMUNITY): Payer: Self-pay

## 2018-02-04 ENCOUNTER — Inpatient Hospital Stay (HOSPITAL_COMMUNITY): Payer: Medicare HMO | Attending: Internal Medicine

## 2018-02-04 VITALS — BP 144/42 | HR 110 | Temp 97.6°F | Resp 18

## 2018-02-04 DIAGNOSIS — D508 Other iron deficiency anemias: Secondary | ICD-10-CM

## 2018-02-04 DIAGNOSIS — D509 Iron deficiency anemia, unspecified: Secondary | ICD-10-CM | POA: Insufficient documentation

## 2018-02-04 MED ORDER — SODIUM CHLORIDE 0.9 % IV SOLN
750.0000 mg | Freq: Once | INTRAVENOUS | Status: AC
  Administered 2018-02-04: 750 mg via INTRAVENOUS
  Filled 2018-02-04: qty 15

## 2018-02-04 MED ORDER — SODIUM CHLORIDE 0.9 % IV SOLN
Freq: Once | INTRAVENOUS | Status: AC
  Administered 2018-02-04: 14:00:00 via INTRAVENOUS

## 2018-02-04 NOTE — Patient Instructions (Signed)
[facility] Discharge Instructions  Received Injectafer infusion today. Follow-up as scheduled. Call clinic for any questions or concerns   Thank you for choosing [facility] at [facility] to provide your oncology and hematology care.  To afford each patient quality time with our provider, please arrive at least 15 minutes before your scheduled appointment time.   If you have a lab appointment with the Cancer Center please come in thru the  Main Entrance and check in at the main information desk  You need to re-schedule your appointment should you arrive 10 or more minutes late.  We strive to give you quality time with our providers, and arriving late affects you and other patients whose appointments are after yours.  Also, if you no show three or more times for appointments you may be dismissed from the clinic at the providers discretion.     Again, thank you for choosing [facility].  Our hope is that these requests will decrease the amount of time that you wait before being seen by our physicians.       _____________________________________________________________  Should you have questions after your visit to [facility], please contact our office at [phone] between the hours of 8:00 a.m. and 4:30 p.m.  Voicemails left after 4:00 p.m. will not be returned until the following business day.  For prescription refill requests, have your pharmacy contact our office and allow 72 hours.    Cancer Center Support Programs:   > Cancer Support Group  2nd Tuesday of the month 1pm-2pm, Journey Room   

## 2018-02-04 NOTE — Progress Notes (Signed)
        Iron therapy administered today per MD orders. Tolerated infusion without adverse affects. Vital signs stable. No complaints at this time. Discharged from clinic via wheelchair. F/U with [facility] as scheduled.

## 2018-03-13 ENCOUNTER — Inpatient Hospital Stay (HOSPITAL_COMMUNITY): Payer: Medicare HMO | Attending: Hematology

## 2018-03-13 DIAGNOSIS — D509 Iron deficiency anemia, unspecified: Secondary | ICD-10-CM | POA: Diagnosis not present

## 2018-03-13 DIAGNOSIS — D508 Other iron deficiency anemias: Secondary | ICD-10-CM

## 2018-03-13 LAB — CBC WITH DIFFERENTIAL/PLATELET
ABS IMMATURE GRANULOCYTES: 0 10*3/uL (ref 0.00–0.07)
BASOS PCT: 1 %
Basophils Absolute: 0 10*3/uL (ref 0.0–0.1)
Eosinophils Absolute: 0 10*3/uL (ref 0.0–0.5)
Eosinophils Relative: 1 %
HCT: 34.1 % — ABNORMAL LOW (ref 39.0–52.0)
Hemoglobin: 11 g/dL — ABNORMAL LOW (ref 13.0–17.0)
Immature Granulocytes: 0 %
Lymphocytes Relative: 25 %
Lymphs Abs: 0.8 10*3/uL (ref 0.7–4.0)
MCH: 28.9 pg (ref 26.0–34.0)
MCHC: 32.3 g/dL (ref 30.0–36.0)
MCV: 89.5 fL (ref 80.0–100.0)
MONOS PCT: 16 %
Monocytes Absolute: 0.5 10*3/uL (ref 0.1–1.0)
NEUTROS ABS: 1.8 10*3/uL (ref 1.7–7.7)
NRBC: 0 % (ref 0.0–0.2)
Neutrophils Relative %: 57 %
PLATELETS: 91 10*3/uL — AB (ref 150–400)
RBC: 3.81 MIL/uL — ABNORMAL LOW (ref 4.22–5.81)
WBC: 3.2 10*3/uL — AB (ref 4.0–10.5)

## 2018-03-13 LAB — COMPREHENSIVE METABOLIC PANEL
ALK PHOS: 51 U/L (ref 38–126)
ALT: 11 U/L (ref 0–44)
ANION GAP: 4 — AB (ref 5–15)
AST: 17 U/L (ref 15–41)
Albumin: 3.5 g/dL (ref 3.5–5.0)
BILIRUBIN TOTAL: 0.4 mg/dL (ref 0.3–1.2)
BUN: 22 mg/dL (ref 8–23)
CALCIUM: 8.3 mg/dL — AB (ref 8.9–10.3)
CHLORIDE: 110 mmol/L (ref 98–111)
CO2: 25 mmol/L (ref 22–32)
Creatinine, Ser: 0.93 mg/dL (ref 0.61–1.24)
GFR calc non Af Amer: >60 mL/min (ref >60)
Glucose, Bld: 105 mg/dL — ABNORMAL HIGH (ref 70–99)
Potassium: 3.9 mmol/L (ref 3.5–5.1)
SODIUM: 139 mmol/L (ref 135–145)
TOTAL PROTEIN: 6.2 g/dL — AB (ref 6.5–8.1)

## 2018-03-13 LAB — FERRITIN: Ferritin: 199 ng/mL (ref 24–336)

## 2018-03-13 LAB — LACTATE DEHYDROGENASE: LDH: 160 U/L (ref 98–192)

## 2018-03-17 ENCOUNTER — Other Ambulatory Visit: Payer: Self-pay

## 2018-03-17 ENCOUNTER — Inpatient Hospital Stay (HOSPITAL_COMMUNITY): Payer: Medicare HMO | Attending: Internal Medicine | Admitting: Internal Medicine

## 2018-03-17 ENCOUNTER — Encounter (HOSPITAL_COMMUNITY): Payer: Self-pay | Admitting: Internal Medicine

## 2018-03-17 VITALS — BP 158/61 | HR 45 | Temp 97.5°F | Resp 18

## 2018-03-17 DIAGNOSIS — Z79899 Other long term (current) drug therapy: Secondary | ICD-10-CM | POA: Diagnosis not present

## 2018-03-17 DIAGNOSIS — Z7901 Long term (current) use of anticoagulants: Secondary | ICD-10-CM | POA: Diagnosis not present

## 2018-03-17 DIAGNOSIS — D696 Thrombocytopenia, unspecified: Secondary | ICD-10-CM | POA: Diagnosis present

## 2018-03-17 DIAGNOSIS — D509 Iron deficiency anemia, unspecified: Secondary | ICD-10-CM

## 2018-03-17 DIAGNOSIS — D72819 Decreased white blood cell count, unspecified: Secondary | ICD-10-CM

## 2018-03-17 NOTE — Patient Instructions (Signed)
[facility]  Discharge Instructions: You saw Dr. Higgs today                               _______________________________________________________________  Thank you for choosing [facility] at [facility] to provide your oncology and hematology care.  To afford each patient quality time with our providers, please arrive at least 15 minutes before your scheduled appointment.  You need to re-schedule your appointment if you arrive 10 or more minutes late.  We strive to give you quality time with our providers, and arriving late affects you and other patients whose appointments are after yours.  Also, if you no show three or more times for appointments you may be dismissed from the clinic.  Again, thank you for choosing [facility] at [facility]. Our hope is that these requests will allow you access to exceptional care and in a timely manner. _______________________________________________________________  If you have questions after your visit, please contact our office at [phone] between the hours of 8:30 a.m. and 5:00 p.m. Voicemails left after 4:30 p.m. will not be returned until the following business day. _______________________________________________________________  For prescription refill requests, have your pharmacy contact our office. _______________________________________________________________  Recommendations made by the consultant and any test results will be sent to your referring physician. _______________________________________________________________ 

## 2018-03-17 NOTE — Progress Notes (Signed)
Diagnosis Thrombocytopenia (HCC) - Plan: CBC with Differential/Platelet, Comprehensive metabolic panel, Lactate dehydrogenase  Staging Cancer Staging No matching staging information was found for the patient.  Assessment and Plan:  1.  Iron deficiency anemia.  Previously, I discussed with the patient and his wife via phone that he was showing evidence of anemia with a hemoglobin of 7.8 and an iron level that was decreased at 5.  They were given option of blood transfusion or a trial of intravenous iron.  Patient and wife reported at that time they did not desire blood transfusion or iron therapy.  They will try diet.  I have explained to them his blood counts are low and this could contribute to issues of shortness of breath, chest pain, worsening fatigue.  They are advised if he develops any symptoms he should report to the emergency room for evaluation.    Labs done 01/15/2018 reviewed and showed WBC 3.4 HB 8 plts 150,000.  Chemistries WNL with K+ 4, Cr 1 and normal LFTS.  Ferritin was decreased at 6. Pt has an intolerance to oral iron. He was eventually agreeable to IV iron and was last treated on 02/04/2018.  Labs done 03/13/2018 reviewed and showed WBC 3.2 HB 11 plts 91,000.  Ferritin 199.  HB and iron levels improved after IV iron.  Pt will have repeat labs in 04/2018 and follow-up at that time to go over results.    2.  Thrombocytopenia.  Plt count 91,000 on labs done 03/13/2018.  No significant abnormalities noted.  Previously, Plt count was 150,000 on labs done 12/2017.  Pt will have repeat labs in 04/2018 for follow-up.    3  Leukopenia.  WBC stable at 3.2.  Likely normal variant.  Will repeat labs in 04/2018.    4.  Stroke.  Patient reports this occurred in 2013 in 2015.  He is currently on Eliquis.  Current Status:  Pt is seen today for follow-up.  Pt is here to go over labs.    Problem List Patient Active Problem List   Diagnosis Date Noted  . Iron deficiency anemia [D50.9]  01/22/2018    Past Medical History Past Medical History:  Diagnosis Date  . Anemia   . Clotting disorder (HCC)   . GERD (gastroesophageal reflux disease)   . Iron deficiency anemia 01/22/2018    Past Surgical History History reviewed. No pertinent surgical history.  Family History History reviewed. No pertinent family history.   Social History  reports that he has never smoked. He has never used smokeless tobacco. He reports that he does not drink alcohol or use drugs.  Medications  Current Outpatient Medications:  .  acetaminophen (TYLENOL) 500 MG tablet, Take 500 mg by mouth as needed for moderate pain., Disp: , Rfl:  .  Cholecalciferol (VITAMIN D3) 2000 units TABS, Take 1 tablet by mouth daily., Disp: , Rfl:  .  ELIQUIS 5 MG TABS tablet, Take 5 mg by mouth 2 (two) times daily. , Disp: , Rfl:  .  ranitidine (ZANTAC) 150 MG tablet, Take 150 mg by mouth daily. , Disp: , Rfl:  .  tamsulosin (FLOMAX) 0.4 MG CAPS capsule, Take 0.4 mg by mouth at bedtime. , Disp: , Rfl:   Allergies Patient has no known allergies.  Review of Systems Review of Systems - Oncology ROS negative   Physical Exam  Vitals Wt Readings from Last 3 Encounters:  12/26/17 156 lb 14.4 oz (71.2 kg)   Temp Readings from Last 3 Encounters:  03/17/18 Marland Kitchen)  97.5 F (36.4 C) (Oral)  02/04/18 97.6 F (36.4 C) (Oral)  01/27/18 97.6 F (36.4 C) (Oral)   BP Readings from Last 3 Encounters:  03/17/18 (!) 158/61  02/04/18 (!) 144/42  01/27/18 (!) 149/50   Pulse Readings from Last 3 Encounters:  03/17/18 (!) 45  02/04/18 (!) 110  01/27/18 (!) 48   Constitutional: Well-developed, well-nourished, and in no distress.   HENT: Head: Normocephalic and atraumatic.  Mouth/Throat: No oropharyngeal exudate. Mucosa moist. Eyes: Pupils are equal, round, and reactive to light. Conjunctivae are normal. No scleral icterus.  Neck: Normal range of motion. Neck supple. No JVD present.  Cardiovascular: Normal rate,  regular rhythm and normal heart sounds.  Exam reveals no gallop and no friction rub.   No murmur heard. Pulmonary/Chest: Effort normal and breath sounds normal. No respiratory distress. No wheezes.No rales.  Abdominal: Soft. Bowel sounds are normal. No distension. There is no tenderness. There is no guarding.  Musculoskeletal: No edema or tenderness.  Lymphadenopathy: No cervical, axillary or supraclavicular adenopathy.  Neurological: Alert and oriented to person, place, and time. No cranial nerve deficit.  Skin: Skin is warm and dry. No rash noted. No erythema. No pallor.  Psychiatric: Affect and judgment normal.   Labs No visits with results within 3 Day(s) from this visit.  Latest known visit with results is:  Appointment on 03/13/2018  Component Date Value Ref Range Status  . WBC 03/13/2018 3.2* 4.0 - 10.5 K/uL Final  . RBC 03/13/2018 3.81* 4.22 - 5.81 MIL/uL Final  . Hemoglobin 03/13/2018 11.0* 13.0 - 17.0 g/dL Final  . HCT 40/98/119112/03/2018 34.1* 39.0 - 52.0 % Final  . MCV 03/13/2018 89.5  80.0 - 100.0 fL Final  . MCH 03/13/2018 28.9  26.0 - 34.0 pg Final  . MCHC 03/13/2018 32.3  30.0 - 36.0 g/dL Final  . RDW 47/82/956212/03/2018 Not Measured  11.5 - 15.5 % Final  . Platelets 03/13/2018 91* 150 - 400 K/uL Final   Comment: PLATELET COUNT CONFIRMED BY SMEAR SPECIMEN CHECKED FOR CLOTS Immature Platelet Fraction may be clinically indicated, consider ordering this additional test [id number]   . nRBC 03/13/2018 0.0  0.0 - 0.2 % Final  . Neutrophils Relative % 03/13/2018 57  % Final  . Neutro Abs 03/13/2018 1.8  1.7 - 7.7 K/uL Final  . Lymphocytes Relative 03/13/2018 25  % Final  . Lymphs Abs 03/13/2018 0.8  0.7 - 4.0 K/uL Final  . Monocytes Relative 03/13/2018 16  % Final  . Monocytes Absolute 03/13/2018 0.5  0.1 - 1.0 K/uL Final  . Eosinophils Relative 03/13/2018 1  % Final  . Eosinophils Absolute 03/13/2018 0.0  0.0 - 0.5 K/uL Final  . Basophils Relative 03/13/2018 1  % Final  . Basophils  Absolute 03/13/2018 0.0  0.0 - 0.1 K/uL Final  . Immature Granulocytes 03/13/2018 0  % Final  . Abs Immature Granulocytes 03/13/2018 0.00  0.00 - 0.07 K/uL Final  . Schistocytes 03/13/2018 PRESENT   Final  . Dimorphism 03/13/2018 PRESENT   Final   Performed at [facility], [address] [place], [place] [address]  . Sodium 03/13/2018 139  135 - 145 mmol/L Final  . Potassium 03/13/2018 3.9  3.5 - 5.1 mmol/L Final  . Chloride 03/13/2018 110  98 - 111 mmol/L Final  . CO2 03/13/2018 25  22 - 32 mmol/L Final  . Glucose, Bld 03/13/2018 105* 70 - 99 mg/dL Final  . BUN 95/28/413212/03/2018 22  8 - 23 mg/dL Final  .  Creatinine, Ser 03/13/2018 0.93  0.61 - 1.24 mg/dL Final  . Calcium 16/01/9603 8.3* 8.9 - 10.3 mg/dL Final  . Total Protein 03/13/2018 6.2* 6.5 - 8.1 g/dL Final  . Albumin 54/12/8117 3.5  3.5 - 5.0 g/dL Final  . AST 14/78/2956 17  15 - 41 U/L Final  . ALT 03/13/2018 11  0 - 44 U/L Final  . Alkaline Phosphatase 03/13/2018 51  38 - 126 U/L Final  . Total Bilirubin 03/13/2018 0.4  0.3 - 1.2 mg/dL Final  . GFR calc non Af Amer 03/13/2018 >60  >60 mL/min Final  . GFR calc Af Amer 03/13/2018 >60  >60 mL/min Final  . Anion gap 03/13/2018 4* 5 - 15 Final   Performed at [facility], [address] [place], [place] [address]  . LDH 03/13/2018 160  98 - 192 U/L Final   Performed at [facility], [address], [place], [place] [address]  . Ferritin 03/13/2018 199  24 - 336 ng/mL Final   Performed at [facility], [address], [place], [place] [address]     Pathology Orders Placed This Encounter  Procedures  . CBC with Differential/Platelet    Standing Status:   Future    Standing Expiration Date:   03/17/2020  . Comprehensive metabolic panel    Standing Status:   Future    Standing Expiration Date:   03/17/2020  . Lactate dehydrogenase    Standing Status:   Future    Standing Expiration Date:   03/17/2020       Ahmed Prima MD

## 2018-04-23 ENCOUNTER — Inpatient Hospital Stay (HOSPITAL_COMMUNITY): Payer: Medicare HMO | Attending: Internal Medicine | Admitting: Internal Medicine

## 2018-04-23 ENCOUNTER — Inpatient Hospital Stay (HOSPITAL_COMMUNITY): Payer: Medicare HMO

## 2018-04-23 ENCOUNTER — Encounter (HOSPITAL_COMMUNITY): Payer: Self-pay | Admitting: Internal Medicine

## 2018-04-23 VITALS — BP 131/48 | HR 53 | Temp 97.6°F | Resp 18 | Wt 153.0 lb

## 2018-04-23 DIAGNOSIS — D696 Thrombocytopenia, unspecified: Secondary | ICD-10-CM | POA: Diagnosis not present

## 2018-04-23 DIAGNOSIS — D509 Iron deficiency anemia, unspecified: Secondary | ICD-10-CM | POA: Insufficient documentation

## 2018-04-23 DIAGNOSIS — Z79899 Other long term (current) drug therapy: Secondary | ICD-10-CM

## 2018-04-23 DIAGNOSIS — Z8673 Personal history of transient ischemic attack (TIA), and cerebral infarction without residual deficits: Secondary | ICD-10-CM | POA: Diagnosis not present

## 2018-04-23 DIAGNOSIS — D508 Other iron deficiency anemias: Secondary | ICD-10-CM

## 2018-04-23 DIAGNOSIS — D72819 Decreased white blood cell count, unspecified: Secondary | ICD-10-CM | POA: Diagnosis not present

## 2018-04-23 DIAGNOSIS — K5900 Constipation, unspecified: Secondary | ICD-10-CM | POA: Diagnosis not present

## 2018-04-23 DIAGNOSIS — Z7901 Long term (current) use of anticoagulants: Secondary | ICD-10-CM

## 2018-04-23 LAB — COMPREHENSIVE METABOLIC PANEL
ALBUMIN: 3.5 g/dL (ref 3.5–5.0)
ALT: 12 U/L (ref 0–44)
ANION GAP: 7 (ref 5–15)
AST: 17 U/L (ref 15–41)
Alkaline Phosphatase: 52 U/L (ref 38–126)
BUN: 23 mg/dL (ref 8–23)
CO2: 26 mmol/L (ref 22–32)
Calcium: 8.6 mg/dL — ABNORMAL LOW (ref 8.9–10.3)
Chloride: 105 mmol/L (ref 98–111)
Creatinine, Ser: 0.91 mg/dL (ref 0.61–1.24)
GFR calc Af Amer: >60 mL/min (ref >60)
GFR calc non Af Amer: >60 mL/min (ref >60)
GLUCOSE: 93 mg/dL (ref 70–99)
POTASSIUM: 3.7 mmol/L (ref 3.5–5.1)
Sodium: 138 mmol/L (ref 135–145)
Total Bilirubin: 0.3 mg/dL (ref 0.3–1.2)
Total Protein: 6.4 g/dL — ABNORMAL LOW (ref 6.5–8.1)

## 2018-04-23 LAB — CBC WITH DIFFERENTIAL/PLATELET
Abs Immature Granulocytes: 0.01 10*3/uL (ref 0.00–0.07)
Basophils Absolute: 0 10*3/uL (ref 0.0–0.1)
Basophils Relative: 0 %
EOS ABS: 0.1 10*3/uL (ref 0.0–0.5)
Eosinophils Relative: 2 %
HCT: 35.2 % — ABNORMAL LOW (ref 39.0–52.0)
Hemoglobin: 11.2 g/dL — ABNORMAL LOW (ref 13.0–17.0)
Immature Granulocytes: 0 %
Lymphocytes Relative: 21 %
Lymphs Abs: 0.7 10*3/uL (ref 0.7–4.0)
MCH: 30 pg (ref 26.0–34.0)
MCHC: 31.8 g/dL (ref 30.0–36.0)
MCV: 94.4 fL (ref 80.0–100.0)
Monocytes Absolute: 0.3 10*3/uL (ref 0.1–1.0)
Monocytes Relative: 9 %
NEUTROS PCT: 68 %
Neutro Abs: 2.2 10*3/uL (ref 1.7–7.7)
Platelets: 96 10*3/uL — ABNORMAL LOW (ref 150–400)
RBC: 3.73 MIL/uL — ABNORMAL LOW (ref 4.22–5.81)
RDW: 18.1 % — ABNORMAL HIGH (ref 11.5–15.5)
WBC: 3.2 10*3/uL — ABNORMAL LOW (ref 4.0–10.5)
nRBC: 0 % (ref 0.0–0.2)

## 2018-04-23 LAB — PROTIME-INR
INR: 1.22
Prothrombin Time: 15.3 seconds — ABNORMAL HIGH (ref 11.4–15.2)

## 2018-04-23 LAB — LACTATE DEHYDROGENASE: LDH: 157 U/L (ref 98–192)

## 2018-04-23 LAB — APTT: APTT: 33 s (ref 24–36)

## 2018-04-23 NOTE — Patient Instructions (Signed)
[facility] at [facility] Discharge Instructions  You were seen by Dr. Higgs today   Thank you for choosing [facility] at [facility] to provide your oncology and hematology care.  To afford each patient quality time with our provider, please arrive at least 15 minutes before your scheduled appointment time.   If you have a lab appointment with the Cancer Center please come in thru the  Main Entrance and check in at the main information desk  You need to re-schedule your appointment should you arrive 10 or more minutes late.  We strive to give you quality time with our providers, and arriving late affects you and other patients whose appointments are after yours.  Also, if you no show three or more times for appointments you may be dismissed from the clinic at the providers discretion.     Again, thank you for choosing [facility].  Our hope is that these requests will decrease the amount of time that you wait before being seen by our physicians.       _____________________________________________________________  Should you have questions after your visit to [facility], please contact our office at [phone] between the hours of 8:00 a.m. and 4:30 p.m.  Voicemails left after 4:00 p.m. will not be returned until the following business day.  For prescription refill requests, have your pharmacy contact our office and allow 72 hours.    Cancer Center Support Programs:   > Cancer Support Group  2nd Tuesday of the month 1pm-2pm, Journey Room   

## 2018-04-23 NOTE — Progress Notes (Signed)
Diagnosis Thrombocytopenia (HCC) - Plan: Hepatitis B surface antibody, Hepatitis B surface antigen, Hepatitis B core antibody, total, Hepatitis panel, acute, Hepatitis C RNA quantitative, HIV Antibody (routine testing w rflx), Protime-INR, APTT, Platelet Count on Citrated Bld, CBC with Differential/Platelet, Comprehensive metabolic panel, Lactate dehydrogenase, Ferritin  Other iron deficiency anemia - Plan: CBC with Differential/Platelet, Comprehensive metabolic panel, Lactate dehydrogenase, Ferritin  Staging Cancer Staging No matching staging information was found for the patient.  Assessment and Plan:  1.  Iron deficiency anemia.  Previously, I discussed with the patient and his wife via phone that he was showing evidence of anemia with a hemoglobin of 7.8 and an iron level that was decreased at 5.  They were given option of blood transfusion or a trial of intravenous iron.  Patient and wife reported at that time they did not desire blood transfusion or iron therapy.  They will try diet.  I explained to them his blood counts are low and this could contribute to issues of shortness of breath, chest pain, worsening fatigue.  They are advised if he develops any symptoms he should report to the emergency room for evaluation.    Labs done 01/15/2018 showed WBC 3.4 HB 8 plts 150,000.  Chemistries WNL with K+ 4, Cr 1 and normal LFTS.  Ferritin was decreased at 6. Pt has an intolerance to oral iron. He was eventually agreeable to IV iron and was last treated on 02/04/2018.  HB and iron levels improved after IV iron.    Labs repeated today 04/23/2018 reviewed and showed WBC 3.2 HB 11.2 plts 96,000.  Chemistries WNL with K+ 3.7 Cr 0.91 and normal LFTs.  HB remains improved after IV iron.  He will have repeat labs in 05/2018 and will follow-up to go over resuts.    2.  Thrombocytopenia.  Have spoken with hematology today.  Plt count 91,000 on labs done 03/13/2018.  Smear shows some clumping and fragmentation.   LDH normal at 157.  He has normal Cr of 0.91. PT 15.3 and PTT 33.   Previously, Plt count was 150,000 on labs done 12/2017.  Will check Hep panel, HIV, and plt count in citrated tube.  Pt denies any new medications other than famotidine.  Pending remaining lab results may consider imaging to evaluate liver and spleen.    3  Leukopenia.  WBC stable at 3.2.  Likely normal variant.  Will repeat labs in 05/2018.    4.  Stroke.  Patient reports this occurred in 2013.  He is currently on Eliquis.  5.  Constipation.  Stool softeners recommended.  If no improvement will consider imaging due to IDA history.    25 minutes spent with more than 50% spent in counseling and coordination of care.    Current Status:  Pt is seen today for follow-up.  Pt is here to go over labs.  He reports constipation.    Problem List Patient Active Problem List   Diagnosis Date Noted  . Iron deficiency anemia [D50.9] 01/22/2018    Past Medical History Past Medical History:  Diagnosis Date  . Anemia   . Clotting disorder (HCC)   . GERD (gastroesophageal reflux disease)   . Iron deficiency anemia 01/22/2018    Past Surgical History History reviewed. No pertinent surgical history.  Family History History reviewed. No pertinent family history.   Social History  reports that he has never smoked. He has never used smokeless tobacco. He reports that he does not drink alcohol or use drugs.  Medications  Current Outpatient Medications:  .  acetaminophen (TYLENOL) 500 MG tablet, Take 500 mg by mouth as needed for moderate pain., Disp: , Rfl:  .  Cholecalciferol (VITAMIN D3) 2000 units TABS, Take 1 tablet by mouth daily., Disp: , Rfl:  .  ELIQUIS 5 MG TABS tablet, Take 5 mg by mouth 2 (two) times daily. , Disp: , Rfl:  .  ranitidine (ZANTAC) 150 MG tablet, Take 150 mg by mouth daily. , Disp: , Rfl:  .  tamsulosin (FLOMAX) 0.4 MG CAPS capsule, Take 0.4 mg by mouth at bedtime. , Disp: , Rfl:   Allergies Patient has  no known allergies.  Review of Systems Review of Systems - Oncology ROS negative other than constipation   Physical Exam  Vitals Wt Readings from Last 3 Encounters:  04/23/18 153 lb (69.4 kg)  12/26/17 156 lb 14.4 oz (71.2 kg)   Temp Readings from Last 3 Encounters:  04/23/18 97.6 F (36.4 C) (Oral)  03/17/18 (!) 97.5 F (36.4 C) (Oral)  02/04/18 97.6 F (36.4 C) (Oral)   BP Readings from Last 3 Encounters:  04/23/18 (!) 131/48  03/17/18 (!) 158/61  02/04/18 (!) 144/42   Pulse Readings from Last 3 Encounters:  04/23/18 (!) 53  03/17/18 (!) 45  02/04/18 (!) 110   Constitutional: Well-developed, well-nourished, and in no distress.   HENT: Head: Normocephalic and atraumatic.  Mouth/Throat: No oropharyngeal exudate. Mucosa moist. Eyes: Pupils are equal, round, and reactive to light. Conjunctivae are normal. No scleral icterus.  Neck: Normal range of motion. Neck supple. No JVD present.  Cardiovascular: Normal rate, regular rhythm and normal heart sounds.  Exam reveals no gallop and no friction rub.   No murmur heard. Pulmonary/Chest: Effort normal and breath sounds normal. No respiratory distress. No wheezes.No rales.  Abdominal: Soft. Bowel sounds are normal. No distension. There is no tenderness. There is no guarding.  Musculoskeletal: No edema or tenderness.  Lymphadenopathy: No cervical, axillary or supraclavicular adenopathy.  Neurological: Alert and oriented to person, place, and time. No cranial nerve deficit.  Skin: Skin is warm and dry. No rash noted. No erythema. No pallor.  Psychiatric: Affect and judgment normal.   Labs Appointment on 04/23/2018  Component Date Value Ref Range Status  . WBC 04/23/2018 3.2* 4.0 - 10.5 K/uL Final  . RBC 04/23/2018 3.73* 4.22 - 5.81 MIL/uL Final  . Hemoglobin 04/23/2018 11.2* 13.0 - 17.0 g/dL Final  . HCT 16/10/960401/22/2020 35.2* 39.0 - 52.0 % Final  . MCV 04/23/2018 94.4  80.0 - 100.0 fL Final  . MCH 04/23/2018 30.0  26.0 - 34.0  pg Final  . MCHC 04/23/2018 31.8  30.0 - 36.0 g/dL Final  . RDW 54/09/811901/22/2020 18.1* 11.5 - 15.5 % Final  . Platelets 04/23/2018 96* 150 - 400 K/uL Final   Comment: SPECIMEN CHECKED FOR CLOTS Immature Platelet Fraction may be clinically indicated, consider ordering this additional test [id number] CONSISTENT WITH PREVIOUS RESULT   . nRBC 04/23/2018 0.0  0.0 - 0.2 % Final  . Neutrophils Relative % 04/23/2018 68  % Final  . Neutro Abs 04/23/2018 2.2  1.7 - 7.7 K/uL Final  . Lymphocytes Relative 04/23/2018 21  % Final  . Lymphs Abs 04/23/2018 0.7  0.7 - 4.0 K/uL Final  . Monocytes Relative 04/23/2018 9  % Final  . Monocytes Absolute 04/23/2018 0.3  0.1 - 1.0 K/uL Final  . Eosinophils Relative 04/23/2018 2  % Final  . Eosinophils Absolute 04/23/2018 0.1  0.0 - 0.5 K/uL  Final  . Basophils Relative 04/23/2018 0  % Final  . Basophils Absolute 04/23/2018 0.0  0.0 - 0.1 K/uL Final  . Immature Granulocytes 04/23/2018 0  % Final  . Abs Immature Granulocytes 04/23/2018 0.01  0.00 - 0.07 K/uL Final   Performed at [facility], [address] [place], [place] [address]  . Sodium 04/23/2018 138  135 - 145 mmol/L Final  . Potassium 04/23/2018 3.7  3.5 - 5.1 mmol/L Final  . Chloride 04/23/2018 105  98 - 111 mmol/L Final  . CO2 04/23/2018 26  22 - 32 mmol/L Final  . Glucose, Bld 04/23/2018 93  70 - 99 mg/dL Final  . BUN 60/45/409801/22/2020 23  8 - 23 mg/dL Final  . Creatinine, Ser 04/23/2018 0.91  0.61 - 1.24 mg/dL Final  . Calcium 11/91/478201/22/2020 8.6* 8.9 - 10.3 mg/dL Final  . Total Protein 04/23/2018 6.4* 6.5 - 8.1 g/dL Final  . Albumin 95/62/130801/22/2020 3.5  3.5 - 5.0 g/dL Final  . AST 65/78/469601/22/2020 17  15 - 41 U/L Final  . ALT 04/23/2018 12  0 - 44 U/L Final  . Alkaline Phosphatase 04/23/2018 52  38 - 126 U/L Final  . Total Bilirubin 04/23/2018 0.3  0.3 - 1.2 mg/dL Final  . GFR calc non Af Amer 04/23/2018 >60  >60 mL/min Final  . GFR calc Af Amer 04/23/2018 >60  >60 mL/min Final  . Anion gap 04/23/2018 7  5 - 15 Final    Performed at [facility], [address] [place], [place] [address]  . LDH 04/23/2018 157  98 - 192 U/L Final   Performed at [facility], [address] [place], [place] [address]  . Prothrombin Time 04/23/2018 15.3* 11.4 - 15.2 seconds Final  . INR 04/23/2018 1.22   Final   Performed at [facility], [address] [place], [place] [address]  . aPTT 04/23/2018 33  24 - 36 seconds Final   Performed at [facility], [address] [place], [place] [address]     Pathology Orders Placed This Encounter  Procedures  . Hepatitis B surface antibody    Standing Status:   Future    Number of Occurrences:   1    Standing Expiration Date:   04/24/2019  . Hepatitis B surface antigen    Standing Status:   Future    Number of Occurrences:   1    Standing Expiration Date:   04/24/2019  . Hepatitis B core antibody, total    Standing Status:   Future    Number of Occurrences:   1    Standing Expiration Date:   04/24/2019  . Hepatitis panel, acute    Standing Status:   Future    Number of Occurrences:   1    Standing Expiration Date:   04/24/2019  . Hepatitis C RNA quantitative    Standing Status:   Future    Number of Occurrences:   1    Standing Expiration Date:   04/24/2019  . HIV Antibody (routine testing w rflx)    Standing Status:   Future    Number of Occurrences:   1    Standing Expiration Date:   04/24/2019  . Protime-INR    Standing Status:   Future    Number of Occurrences:   1    Standing Expiration Date:   04/24/2019  . APTT    Standing Status:   Future    Number of Occurrences:   1  Standing Expiration Date:   04/24/2019  . Platelet Count on Citrated Bld    Standing Status:   Future    Number of Occurrences:   1    Standing Expiration Date:   04/24/2019  . CBC with Differential/Platelet    Standing Status:   Future    Standing Expiration Date:   04/24/2019  . Comprehensive metabolic panel    Standing Status:   Future    Standing Expiration Date:   04/24/2019  . Lactate  dehydrogenase    Standing Status:   Future    Standing Expiration Date:   04/24/2019  . Ferritin    Standing Status:   Future    Standing Expiration Date:   04/24/2019       Ahmed Prima MD

## 2018-04-24 LAB — HEPATITIS PANEL, ACUTE
HCV Ab: 0.2 s/co ratio (ref 0.0–0.9)
Hep A IgM: NEGATIVE
Hep B C IgM: NEGATIVE
Hepatitis B Surface Ag: NEGATIVE

## 2018-04-24 LAB — HEPATITIS B SURFACE ANTIBODY,QUALITATIVE: Hep B S Ab: REACTIVE

## 2018-04-24 LAB — HEPATITIS B SURFACE ANTIGEN: Hepatitis B Surface Ag: NEGATIVE

## 2018-04-24 LAB — HEPATITIS B CORE ANTIBODY, TOTAL: HEP B C TOTAL AB: NEGATIVE

## 2018-04-25 LAB — HIV ANTIBODY (ROUTINE TESTING W REFLEX): HIV Screen 4th Generation wRfx: NONREACTIVE

## 2018-05-21 ENCOUNTER — Encounter (HOSPITAL_COMMUNITY): Payer: Self-pay | Admitting: Internal Medicine

## 2018-05-21 ENCOUNTER — Inpatient Hospital Stay (HOSPITAL_COMMUNITY): Payer: Medicare HMO | Admitting: Internal Medicine

## 2018-05-21 ENCOUNTER — Inpatient Hospital Stay (HOSPITAL_COMMUNITY): Payer: Medicare HMO | Attending: Hematology

## 2018-05-21 ENCOUNTER — Other Ambulatory Visit (HOSPITAL_COMMUNITY): Payer: Self-pay | Admitting: Internal Medicine

## 2018-05-21 VITALS — BP 142/43 | HR 73 | Resp 18 | Wt 174.0 lb

## 2018-05-21 DIAGNOSIS — K5900 Constipation, unspecified: Secondary | ICD-10-CM

## 2018-05-21 DIAGNOSIS — Z8673 Personal history of transient ischemic attack (TIA), and cerebral infarction without residual deficits: Secondary | ICD-10-CM | POA: Insufficient documentation

## 2018-05-21 DIAGNOSIS — Z79899 Other long term (current) drug therapy: Secondary | ICD-10-CM

## 2018-05-21 DIAGNOSIS — D696 Thrombocytopenia, unspecified: Secondary | ICD-10-CM | POA: Diagnosis not present

## 2018-05-21 DIAGNOSIS — Z7901 Long term (current) use of anticoagulants: Secondary | ICD-10-CM

## 2018-05-21 DIAGNOSIS — D72819 Decreased white blood cell count, unspecified: Secondary | ICD-10-CM | POA: Insufficient documentation

## 2018-05-21 DIAGNOSIS — D509 Iron deficiency anemia, unspecified: Secondary | ICD-10-CM | POA: Diagnosis not present

## 2018-05-21 DIAGNOSIS — D508 Other iron deficiency anemias: Secondary | ICD-10-CM

## 2018-05-21 LAB — FERRITIN: Ferritin: 176 ng/mL (ref 24–336)

## 2018-05-21 LAB — CBC WITH DIFFERENTIAL/PLATELET
Abs Immature Granulocytes: 0.01 10*3/uL (ref 0.00–0.07)
Basophils Absolute: 0 10*3/uL (ref 0.0–0.1)
Basophils Relative: 0 %
EOS PCT: 1 %
Eosinophils Absolute: 0 10*3/uL (ref 0.0–0.5)
HCT: 35.3 % — ABNORMAL LOW (ref 39.0–52.0)
Hemoglobin: 11.2 g/dL — ABNORMAL LOW (ref 13.0–17.0)
Immature Granulocytes: 0 %
Lymphocytes Relative: 20 %
Lymphs Abs: 0.7 10*3/uL (ref 0.7–4.0)
MCH: 30.9 pg (ref 26.0–34.0)
MCHC: 31.7 g/dL (ref 30.0–36.0)
MCV: 97.5 fL (ref 80.0–100.0)
Monocytes Absolute: 0.4 10*3/uL (ref 0.1–1.0)
Monocytes Relative: 12 %
Neutro Abs: 2.5 10*3/uL (ref 1.7–7.7)
Neutrophils Relative %: 67 %
Platelets: 94 10*3/uL — ABNORMAL LOW (ref 150–400)
RBC: 3.62 MIL/uL — AB (ref 4.22–5.81)
RDW: 14.1 % (ref 11.5–15.5)
WBC: 3.7 10*3/uL — AB (ref 4.0–10.5)
nRBC: 0 % (ref 0.0–0.2)

## 2018-05-21 LAB — COMPREHENSIVE METABOLIC PANEL
ALT: 12 U/L (ref 0–44)
ANION GAP: 6 (ref 5–15)
AST: 18 U/L (ref 15–41)
Albumin: 3.7 g/dL (ref 3.5–5.0)
Alkaline Phosphatase: 47 U/L (ref 38–126)
BUN: 22 mg/dL (ref 8–23)
CHLORIDE: 108 mmol/L (ref 98–111)
CO2: 26 mmol/L (ref 22–32)
Calcium: 9 mg/dL (ref 8.9–10.3)
Creatinine, Ser: 0.86 mg/dL (ref 0.61–1.24)
GFR calc Af Amer: >60 mL/min (ref >60)
GFR calc non Af Amer: >60 mL/min (ref >60)
Glucose, Bld: 97 mg/dL (ref 70–99)
Potassium: 4.1 mmol/L (ref 3.5–5.1)
Sodium: 140 mmol/L (ref 135–145)
Total Bilirubin: 0.7 mg/dL (ref 0.3–1.2)
Total Protein: 6.5 g/dL (ref 6.5–8.1)

## 2018-05-21 LAB — LACTATE DEHYDROGENASE: LDH: 184 U/L (ref 98–192)

## 2018-05-21 LAB — PLATELET COUNT: Platelets: 90 10*3/uL — ABNORMAL LOW (ref 150–400)

## 2018-05-21 NOTE — Progress Notes (Signed)
Diagnosis Thrombocytopenia (HCC) - Plan: Platelet Count on Citrated Bld, Platelet Count on Citrated Bld, CBC with Differential, Comprehensive metabolic panel, Lactate dehydrogenase, Ferritin, Platelet Count on Citrated Bld  Staging Cancer Staging No matching staging information was found for the patient.  Assessment and Plan:  1.  Iron deficiency anemia.  Previously, I discussed with the patient and his wife via phone that he was showing evidence of anemia with a hemoglobin of 7.8 and an iron level that was decreased at 5.  They were given option of blood transfusion or a trial of intravenous iron.  Patient and wife reported at that time they did not desire blood transfusion or iron therapy.  They wanted to try diet.  I explained to them his blood counts were low and this could contribute to issues of shortness of breath, chest pain, worsening fatigue.  They are advised if he develops any symptoms he should report to the emergency room for evaluation.    Labs done 01/15/2018 showed WBC 3.4 HB 8 plts 150,000.  Chemistries WNL with K+ 4, Cr 1 and normal LFTS.  Ferritin was decreased at 6. Pt has an intolerance to oral iron. He was eventually agreeable to IV iron and was last treated on 02/04/2018.   Labs done 05/21/2018 reviewed and showed WBC 3.7 HB 11.2 plts 94,000.  Ferritin improved at 176.Marland Kitchen  Chemistries WNL with K+ 4.1 Cr 0.86 normal LFTs.  LDH 184.  Pt will RTC with repeat labs in 08/2018.    2.  Thrombocytopenia.  Labs done 05/21/2018 reviewed and showed WBC 3.7 HB 11.2 plts 94,000.  Ferritin improved at 176.Marland Kitchen  Chemistries WNL with K+ 4.1 Cr 0.86 normal LFTs.  LDH 184. Platelet count checked on citrated sample and was 90,000.  Hepatitis panel and HIV testing was negative.  Previously, Plt count was 150,000 on labs done 12/2017.  Pt denies any new medications other than famotidine.  If any worsening of counts may have to hold famotidine due to potential risk of thrombocytopenia with medication.  Due  to IDA and thrombocytopenia he was given option of CT imaging of abdomen and pelvis in order to evaluate the liver and spleen.  I have discussed with pt and wife plt count is less than previous but still adequate.  He will RTC in 08/2018 for repeat labs. Advised to notify the office if any bleeding or bruising.    3  Leukopenia.  WBC improved at 3.7. Likely normal variant.  Will repeat labs in 08/2018.    4.  Stroke.  Patient reports this occurred in 2013.  He is currently on Eliquis.  5.  Constipation.  Stool softeners recommended.  Due to IDA pt was given option of CT imaging.  He does not desire imaging.  Notify office or PCP if any change in symptoms.    25 minutes spent with more than 50% spent in counseling and coordination of care.    Current Status:  Pt is seen today for follow-up.  Pt is here to go over labs.   Problem List Patient Active Problem List   Diagnosis Date Noted  . Iron deficiency anemia [D50.9] 01/22/2018    Past Medical History Past Medical History:  Diagnosis Date  . Anemia   . Clotting disorder (HCC)   . GERD (gastroesophageal reflux disease)   . Iron deficiency anemia 01/22/2018    Past Surgical History History reviewed. No pertinent surgical history.  Family History History reviewed. No pertinent family history.   Social History  reports that he has never smoked. He has never used smokeless tobacco. He reports that he does not drink alcohol or use drugs.  Medications  Current Outpatient Medications:  .  acetaminophen (TYLENOL) 500 MG tablet, Take 500 mg by mouth as needed for moderate pain., Disp: , Rfl:  .  Cholecalciferol (VITAMIN D3) 2000 units TABS, Take 1 tablet by mouth daily., Disp: , Rfl:  .  ELIQUIS 5 MG TABS tablet, Take 5 mg by mouth 2 (two) times daily. , Disp: , Rfl:  .  famotidine (PEPCID) 20 MG tablet, Take 20 mg by mouth daily., Disp: , Rfl:  .  tamsulosin (FLOMAX) 0.4 MG CAPS capsule, Take 0.4 mg by mouth at bedtime. , Disp: , Rfl:    Allergies Patient has no known allergies.  Review of Systems Review of Systems - Oncology ROS negative   Physical Exam  Vitals Wt Readings from Last 3 Encounters:  05/21/18 174 lb (78.9 kg)  04/23/18 153 lb (69.4 kg)  12/26/17 156 lb 14.4 oz (71.2 kg)   Temp Readings from Last 3 Encounters:  04/23/18 97.6 F (36.4 C) (Oral)  03/17/18 (!) 97.5 F (36.4 C) (Oral)  02/04/18 97.6 F (36.4 C) (Oral)   BP Readings from Last 3 Encounters:  05/21/18 (!) 142/43  04/23/18 (!) 131/48  03/17/18 (!) 158/61   Pulse Readings from Last 3 Encounters:  05/21/18 73  04/23/18 (!) 53  03/17/18 (!) 45   Constitutional: Well-developed, well-nourished, and in no distress.   HENT: Head: Normocephalic and atraumatic.  Mouth/Throat: No oropharyngeal exudate. Mucosa moist. Eyes: Pupils are equal, round, and reactive to light. Conjunctivae are normal. No scleral icterus.  Neck: Normal range of motion. Neck supple. No JVD present.  Cardiovascular: Normal rate, regular rhythm and normal heart sounds.  Exam reveals no gallop and no friction rub.   No murmur heard. Pulmonary/Chest: Effort normal and breath sounds normal. No respiratory distress. No wheezes.No rales.  Abdominal: Soft. Bowel sounds are normal. No distension. There is no tenderness. There is no guarding.  Musculoskeletal: No edema or tenderness.  Lymphadenopathy: No cervical, axillary or supraclavicular adenopathy.  Neurological: Alert and oriented to person, place, and time. No cranial nerve deficit.  Skin: Skin is warm and dry. No rash noted. No erythema. No pallor.  Psychiatric: Affect and judgment normal.   Labs Office Visit on 05/21/2018  Component Date Value Ref Range Status  . Platelets 05/21/2018 90* 150 - 400 K/uL Final   Comment: PLATELET COUNT PERFORMED ON CITRATED BLOOD Performed at [facility], [address], [place], [place] [address]   Appointment on 05/21/2018  Component Date Value Ref Range Status  . WBC  05/21/2018 3.7* 4.0 - 10.5 K/uL Final  . RBC 05/21/2018 3.62* 4.22 - 5.81 MIL/uL Final  . Hemoglobin 05/21/2018 11.2* 13.0 - 17.0 g/dL Final  . HCT 44/62/8638 35.3* 39.0 - 52.0 % Final  . MCV 05/21/2018 97.5  80.0 - 100.0 fL Final  . MCH 05/21/2018 30.9  26.0 - 34.0 pg Final  . MCHC 05/21/2018 31.7  30.0 - 36.0 g/dL Final  . RDW 17/71/1657 14.1  11.5 - 15.5 % Final  . Platelets 05/21/2018 94* 150 - 400 K/uL Final   Comment: PLATELET COUNT CONFIRMED BY SMEAR SPECIMEN CHECKED FOR CLOTS Immature Platelet Fraction may be clinically indicated, consider ordering this additional test [id number]   . nRBC 05/21/2018 0.0  0.0 - 0.2 % Final  . Neutrophils Relative % 05/21/2018 67  % Final  . Neutro Abs  05/21/2018 2.5  1.7 - 7.7 K/uL Final  . Lymphocytes Relative 05/21/2018 20  % Final  . Lymphs Abs 05/21/2018 0.7  0.7 - 4.0 K/uL Final  . Monocytes Relative 05/21/2018 12  % Final  . Monocytes Absolute 05/21/2018 0.4  0.1 - 1.0 K/uL Final  . Eosinophils Relative 05/21/2018 1  % Final  . Eosinophils Absolute 05/21/2018 0.0  0.0 - 0.5 K/uL Final  . Basophils Relative 05/21/2018 0  % Final  . Basophils Absolute 05/21/2018 0.0  0.0 - 0.1 K/uL Final  . Immature Granulocytes 05/21/2018 0  % Final  . Abs Immature Granulocytes 05/21/2018 0.01  0.00 - 0.07 K/uL Final   Performed at [facility], [address] [place], [place] [address]  . Sodium 05/21/2018 140  135 - 145 mmol/L Final  . Potassium 05/21/2018 4.1  3.5 - 5.1 mmol/L Final  . Chloride 05/21/2018 108  98 - 111 mmol/L Final  . CO2 05/21/2018 26  22 - 32 mmol/L Final  . Glucose, Bld 05/21/2018 97  70 - 99 mg/dL Final  . BUN 60/45/409802/19/2020 22  8 - 23 mg/dL Final  . Creatinine, Ser 05/21/2018 0.86  0.61 - 1.24 mg/dL Final  . Calcium 11/91/478202/19/2020 9.0  8.9 - 10.3 mg/dL Final  . Total Protein 05/21/2018 6.5  6.5 - 8.1 g/dL Final  . Albumin 95/62/130802/19/2020 3.7  3.5 - 5.0 g/dL Final  . AST 65/78/469602/19/2020 18  15 - 41 U/L Final  . ALT 05/21/2018 12  0 - 44 U/L  Final  . Alkaline Phosphatase 05/21/2018 47  38 - 126 U/L Final  . Total Bilirubin 05/21/2018 0.7  0.3 - 1.2 mg/dL Final  . GFR calc non Af Amer 05/21/2018 >60  >60 mL/min Final  . GFR calc Af Amer 05/21/2018 >60  >60 mL/min Final  . Anion gap 05/21/2018 6  5 - 15 Final   Performed at [facility], [address] [place], [place] [address]  . LDH 05/21/2018 184  98 - 192 U/L Final   Performed at [facility], [address] [place], [place] [address]  . Ferritin 05/21/2018 176  24 - 336 ng/mL Final   Performed at [facility], [address] [place], [place] [address]     Pathology Orders Placed This Encounter  Procedures  . Platelet Count on Citrated Bld    Standing Status:   Future    Number of Occurrences:   1    Standing Expiration Date:   05/22/2019  . Platelet count  . CBC with Differential    Standing Status:   Future    Standing Expiration Date:   05/22/2019  . Comprehensive metabolic panel    Standing Status:   Future    Standing Expiration Date:   05/22/2019  . Lactate dehydrogenase    Standing Status:   Future    Standing Expiration Date:   05/22/2019  . Ferritin    Standing Status:   Future    Standing Expiration Date:   05/22/2019  . Platelet Count on Citrated Bld    Standing Status:   Future    Standing Expiration Date:   05/22/2019       Ahmed PrimaVetta Aundre Hietala MD

## 2018-06-06 ENCOUNTER — Encounter (HOSPITAL_COMMUNITY): Payer: Self-pay | Admitting: *Deleted

## 2018-06-06 NOTE — Progress Notes (Signed)
I received a phone call from the patient today and he states " I am going to put it in God's hands.  You all haven't found anything and I don't want to keep paying my money for you to prod around and trying to find something wrong.  I am just going to let my father above take care of me moving forward. "  I confirmed that I was hearing that the patient no longer wanted to come here for labs or follow up from our physician.  Patient and his wife both verbalized that I understood correctly and they appreciate everything we have done.    I have cancelled all appointments here at the clinic and have notified both Dr. Melton Alar and patient's primary care physician.

## 2018-08-22 ENCOUNTER — Other Ambulatory Visit (HOSPITAL_COMMUNITY): Payer: Self-pay | Admitting: *Deleted

## 2018-08-22 DIAGNOSIS — D696 Thrombocytopenia, unspecified: Secondary | ICD-10-CM

## 2018-08-22 DIAGNOSIS — D508 Other iron deficiency anemias: Secondary | ICD-10-CM

## 2018-08-26 ENCOUNTER — Inpatient Hospital Stay (HOSPITAL_COMMUNITY): Payer: Medicare HMO

## 2018-09-01 ENCOUNTER — Ambulatory Visit (HOSPITAL_COMMUNITY): Payer: Medicare HMO | Admitting: Hematology

## 2019-06-30 ENCOUNTER — Ambulatory Visit: Payer: Medicare HMO

## 2019-06-30 ENCOUNTER — Other Ambulatory Visit: Payer: Self-pay

## 2019-06-30 ENCOUNTER — Encounter: Payer: Self-pay | Admitting: Orthopaedic Surgery

## 2019-06-30 ENCOUNTER — Ambulatory Visit: Payer: Medicare HMO | Admitting: Orthopaedic Surgery

## 2019-06-30 VITALS — BP 131/64 | HR 63 | Ht 67.0 in | Wt 174.0 lb

## 2019-06-30 DIAGNOSIS — W19XXXA Unspecified fall, initial encounter: Secondary | ICD-10-CM

## 2019-06-30 DIAGNOSIS — M25572 Pain in left ankle and joints of left foot: Secondary | ICD-10-CM

## 2019-06-30 NOTE — Progress Notes (Signed)
Subjective:    Patient ID: Nathan Adams, male    DOB: 1923/01/09, 86 y.o.   MRN: [id number]  HPI He fell and hurt his left ankle on 06-23-2019.  He was seen in [place] and had x-rays done.  Report says fracture of medial malleolus on the left.  I do not have X-rays to review.  He has on no cast or splint.  He is in wheelchair.  He had some pain of the left arm and knee but they are doing well now.  He had no head injury.  His pain is controlled. I have reviewed the notes.  Review of Systems  Constitutional: Positive for activity change.  Musculoskeletal: Positive for arthralgias, gait problem and joint swelling.  All other systems reviewed and are negative.  For Review of Systems, all other systems reviewed and are negative.  The following is a summary of the past history medically, past history surgically, known current medicines, social history and family history.  This information is gathered electronically by the computer from prior information and documentation.  I review this each visit and have found including this information at this point in the chart is beneficial and informative.   Past Medical History:  Diagnosis Date  . Anemia   . Clotting disorder ([place])   . GERD (gastroesophageal reflux disease)   . Iron deficiency anemia 01/22/2018    No past surgical history on file.  Current Outpatient Medications on File Prior to Visit  Medication Sig Dispense Refill  . acetaminophen (TYLENOL) 500 MG tablet Take 500 mg by mouth as needed for moderate pain.    . Cholecalciferol (VITAMIN D3) 2000 units TABS Take 1 tablet by mouth daily.    Marland Kitchen ELIQUIS 5 MG TABS tablet Take 5 mg by mouth 2 (two) times daily.     . tamsulosin (FLOMAX) 0.4 MG CAPS capsule Take 0.4 mg by mouth at bedtime.     . famotidine (PEPCID) 20 MG tablet Take 20 mg by mouth daily.     No current facility-administered medications on file prior to visit.    Social History   Socioeconomic History  . Marital  status: Married    Spouse name: Not on file  . Number of children: Not on file  . Years of education: Not on file  . Highest education level: Not on file  Occupational History  . Not on file  Tobacco Use  . Smoking status: Never Smoker  . Smokeless tobacco: Never Used  Substance and Sexual Activity  . Alcohol use: Never  . Drug use: Never  . Sexual activity: Not Currently  Other Topics Concern  . Not on file  Social History Narrative  . Not on file   Social Determinants of Health   Financial Resource Strain:   . Difficulty of Paying Living Expenses:   Food Insecurity:   . Worried About Charity fundraiser in the Last Year:   . Arboriculturist in the Last Year:   Transportation Needs:   . Film/video editor (Medical):   Marland Kitchen Lack of Transportation (Non-Medical):   Physical Activity:   . Days of Exercise per Week:   . Minutes of Exercise per Session:   Stress:   . Feeling of Stress :   Social Connections:   . Frequency of Communication with Friends and Family:   . Frequency of Social Gatherings with Friends and Family:   . Attends Religious Services:   . Active Member of Clubs or Organizations:   .  Attends Banker Meetings:   Marland Kitchen Marital Status:   Intimate Partner Violence:   . Fear of Current or Ex-Partner:   . Emotionally Abused:   Marland Kitchen Physically Abused:   . Sexually Abused:     History reviewed. No pertinent family history.  BP 131/64   Pulse 63   Ht 5\' 7"  (1.702 m)   Wt 174 lb (78.9 kg)   BMI 27.25 kg/m   Body mass index is 27.25 kg/m.     Objective:   Physical Exam Vitals and nursing note reviewed.  Constitutional:      Appearance: He is well-developed.  HENT:     Head: Normocephalic and atraumatic.  Eyes:     Conjunctiva/sclera: Conjunctivae normal.     Pupils: Pupils are equal, round, and reactive to light.  Cardiovascular:     Rate and Rhythm: Normal rate and regular rhythm.  Pulmonary:     Effort: Pulmonary effort is normal.    Abdominal:     Palpations: Abdomen is soft.  Musculoskeletal:     Cervical back: Normal range of motion and neck supple.       Feet:  Skin:    General: Skin is warm and dry.  Neurological:     Mental Status: He is alert and oriented to person, place, and time.     Cranial Nerves: No cranial nerve deficit.     Motor: No abnormal muscle tone.     Coordination: Coordination normal.     Deep Tendon Reflexes: Reflexes are normal and symmetric. Reflexes normal.  Psychiatric:        Behavior: Behavior normal.        Thought Content: Thought content normal.        Judgment: Judgment normal.    X-rays were done of the left ankle, reported separately.  I repeated the films as I did not have any to review.  I explained this to the patient and family.       Assessment & Plan:   Encounter Diagnosis  Name Primary?  . Pain in left ankle and joints of left foot Yes   I do not appreciate fracture of the left ankle.  I have given CAM walker and explained use.  Return in two weeks.  X-ray of the left ankle on return.  Call if any problem.  Precautions discussed.   Electronically Signed , MD 3/30/20212:30 PM

## 2019-07-14 ENCOUNTER — Ambulatory Visit: Payer: Medicare HMO | Admitting: Orthopaedic Surgery

## 2019-07-14 ENCOUNTER — Encounter: Payer: Self-pay | Admitting: Orthopaedic Surgery

## 2019-07-14 ENCOUNTER — Ambulatory Visit: Payer: Medicare HMO

## 2019-07-14 ENCOUNTER — Other Ambulatory Visit: Payer: Self-pay

## 2019-07-14 VITALS — Temp 98.4°F

## 2019-07-14 DIAGNOSIS — M25572 Pain in left ankle and joints of left foot: Secondary | ICD-10-CM

## 2019-07-14 NOTE — Progress Notes (Signed)
Patient Nathan Adams, male DOB:[birth year], 86 y.o. OYD:[id number]  Chief Complaint  Patient presents with  . Ankle Pain    L/doing much better    HPI  Nathan Adams is a 86 y.o. male who has left ankle pain.  He has much less pain.  He has taken himself out of the CAM walker.  He is walking fairly well now.  He has some swelling and some slight pain still.   There is no height or weight on file to calculate BMI.  ROS  Review of Systems  Constitutional: Positive for activity change.  Musculoskeletal: Positive for arthralgias, gait problem and joint swelling.  All other systems reviewed and are negative.   All other systems reviewed and are negative.  The following is a summary of the past history medically, past history surgically, known current medicines, social history and family history.  This information is gathered electronically by the computer from prior information and documentation.  I review this each visit and have found including this information at this point in the chart is beneficial and informative.    Past Medical History:  Diagnosis Date  . Anemia   . Clotting disorder (HCC)   . GERD (gastroesophageal reflux disease)   . Iron deficiency anemia 01/22/2018    History reviewed. No pertinent surgical history.  History reviewed. No pertinent family history.  Social History Social History   Tobacco Use  . Smoking status: Never Smoker  . Smokeless tobacco: Never Used  Substance Use Topics  . Alcohol use: Never  . Drug use: Never    No Known Allergies  Current Outpatient Medications  Medication Sig Dispense Refill  . acetaminophen (TYLENOL) 500 MG tablet Take 500 mg by mouth as needed for moderate pain.    . Cholecalciferol (VITAMIN D3) 2000 units TABS Take 1 tablet by mouth daily.    Marland Kitchen ELIQUIS 5 MG TABS tablet Take 5 mg by mouth 2 (two) times daily.     . famotidine (PEPCID) 20 MG tablet Take 20 mg by mouth daily.    . tamsulosin (FLOMAX) 0.4 MG CAPS  capsule Take 0.4 mg by mouth at bedtime.      No current facility-administered medications for this visit.     Physical Exam  Temperature 98.4 F (36.9 C).  Constitutional: overall normal hygiene, normal nutrition, well developed, normal grooming, normal body habitus. Assistive device:none  Musculoskeletal: gait and station Limp left, muscle tone and strength are normal, no tremors or atrophy is present.  .  Neurological: coordination overall normal.  Deep tendon reflex/nerve stretch intact.  Sensation normal.  Cranial nerves II-XII intact.   Skin:   Normal overall no scars, lesions, ulcers or rashes. No psoriasis.  Psychiatric: Alert and oriented x 3.  Recent memory intact, remote memory unclear.  Normal mood and affect. Well groomed.  Good eye contact.  Cardiovascular: overall no swelling, no varicosities, no edema bilaterally, normal temperatures of the legs and arms, no clubbing, cyanosis and good capillary refill.  Lymphatic: palpation is normal.  Left ankle has some slight swelling.  NV intact. ROM is very good.  All other systems reviewed and are negative   The patient has been educated about the nature of the problem(s) and counseled on treatment options.  The patient appeared to understand what I have discussed and is in agreement with it.  Encounter Diagnosis  Name Primary?  . Pain in left ankle and joints of left foot Yes    PLAN Call if any problems.  Precautions discussed.  Continue current medications.   Return to clinic 3 weeks   Be careful in ambulation.  Electronically Signed Sanjuana Kava, MD 4/13/20212:56 PM

## 2019-08-04 ENCOUNTER — Encounter: Payer: Self-pay | Admitting: Orthopaedic Surgery

## 2019-08-04 ENCOUNTER — Other Ambulatory Visit: Payer: Self-pay

## 2019-08-04 ENCOUNTER — Ambulatory Visit (INDEPENDENT_AMBULATORY_CARE_PROVIDER_SITE_OTHER): Payer: Medicare HMO | Admitting: Orthopaedic Surgery

## 2019-08-04 VITALS — Temp 98.4°F

## 2019-08-04 DIAGNOSIS — M25572 Pain in left ankle and joints of left foot: Secondary | ICD-10-CM | POA: Diagnosis not present

## 2019-08-04 NOTE — Progress Notes (Signed)
My ankle is much better  He has some swelling still of the left ankle but not the pain.  He is doing so much better.  He is pleased.  He has no numbness, no new injury.  Encounter Diagnosis  Name Primary?  . Pain in left ankle and joints of left foot Yes   I will see as needed.  Continue elevation as needed.  Call if any problem.  Precautions discussed.   Electronically Signed Darreld Mclean, MD 5/4/20212:14 PM

## 2020-07-03 ENCOUNTER — Encounter (HOSPITAL_COMMUNITY): Payer: Self-pay | Admitting: Internal Medicine

## 2020-07-03 ENCOUNTER — Other Ambulatory Visit: Payer: Self-pay

## 2020-07-03 ENCOUNTER — Emergency Department (HOSPITAL_COMMUNITY)
Admission: EM | Admit: 2020-07-03 | Discharge: 2020-07-03 | Disposition: A | Discharge status: Home / Self Care | Payer: Medicare Other | Attending: Internal Medicine | Admitting: Internal Medicine

## 2020-07-03 DIAGNOSIS — R310 Gross hematuria: Secondary | ICD-10-CM | POA: Insufficient documentation

## 2020-07-03 DIAGNOSIS — R319 Hematuria, unspecified: Secondary | ICD-10-CM

## 2020-07-03 DIAGNOSIS — Z7901 Long term (current) use of anticoagulants: Secondary | ICD-10-CM | POA: Insufficient documentation

## 2020-07-03 DIAGNOSIS — Z8673 Personal history of transient ischemic attack (TIA), and cerebral infarction without residual deficits: Secondary | ICD-10-CM | POA: Insufficient documentation

## 2020-07-03 LAB — CBC WITH DIFFERENTIAL/PLATELET
Abs Immature Granulocytes: 0 10*3/uL (ref 0.00–0.07)
Basophils Absolute: 0 10*3/uL (ref 0.0–0.1)
Basophils Relative: 0 %
Eosinophils Absolute: 0.1 10*3/uL (ref 0.0–0.5)
Eosinophils Relative: 2 %
HCT: 33.7 % — ABNORMAL LOW (ref 39.0–52.0)
Hemoglobin: 10.8 g/dL — ABNORMAL LOW (ref 13.0–17.0)
Immature Granulocytes: 0 %
Lymphocytes Relative: 34 %
Lymphs Abs: 1 10*3/uL (ref 0.7–4.0)
MCH: 31.3 pg (ref 26.0–34.0)
MCHC: 32 g/dL (ref 30.0–36.0)
MCV: 97.7 fL (ref 80.0–100.0)
Monocytes Absolute: 0.4 10*3/uL (ref 0.1–1.0)
Monocytes Relative: 12 %
Neutro Abs: 1.5 10*3/uL — ABNORMAL LOW (ref 1.7–7.7)
Neutrophils Relative %: 52 %
Platelets: 98 10*3/uL — ABNORMAL LOW (ref 150–400)
RBC: 3.45 MIL/uL — ABNORMAL LOW (ref 4.22–5.81)
RDW: 13.2 % (ref 11.5–15.5)
WBC: 2.9 10*3/uL — ABNORMAL LOW (ref 4.0–10.5)
nRBC: 0 % (ref 0.0–0.2)

## 2020-07-03 LAB — URINALYSIS, ROUTINE W REFLEX MICROSCOPIC
Bacteria, UA: NONE SEEN
Bilirubin Urine: NEGATIVE
Glucose, UA: NEGATIVE mg/dL
Ketones, ur: NEGATIVE mg/dL
Leukocytes,Ua: NEGATIVE
Nitrite: NEGATIVE
Protein, ur: 30 mg/dL — AB
RBC / HPF: >50 RBC/hpf — ABNORMAL HIGH (ref 0–5)
Specific Gravity, Urine: 1.011 (ref 1.005–1.030)
WBC, UA: >50 WBC/hpf — ABNORMAL HIGH (ref 0–5)
pH: 6 (ref 5.0–8.0)

## 2020-07-03 LAB — BASIC METABOLIC PANEL
Anion gap: 6 (ref 5–15)
BUN: 22 mg/dL (ref 8–23)
CO2: 26 mmol/L (ref 22–32)
Calcium: 9 mg/dL (ref 8.9–10.3)
Chloride: 107 mmol/L (ref 98–111)
Creatinine, Ser: 0.94 mg/dL (ref 0.61–1.24)
GFR, Estimated: >60 mL/min (ref >60)
Glucose, Bld: 93 mg/dL (ref 70–99)
Potassium: 4.3 mmol/L (ref 3.5–5.1)
Sodium: 139 mmol/L (ref 135–145)

## 2020-07-03 MED ORDER — CEPHALEXIN 500 MG PO CAPS: 500.0000 mg | ORAL_CAPSULE | Freq: Four times a day (QID) | ORAL | 0 refills | Status: AC

## 2020-07-03 MED ORDER — CEPHALEXIN 500 MG PO CAPS
500.0000 mg | ORAL_CAPSULE | Freq: Once | ORAL | Status: AC
  Administered 2020-07-03: 500 mg via ORAL
  Filled 2020-07-03: qty 1

## 2020-07-03 NOTE — Discharge Instructions (Addendum)
Nathan Adams was seen in the ER today for the blood in his urine.  His physical exam and vital signs are very reassuring.  His blood work also looked good. He has been started on an antibiotic to treat a potential urinary tract infection.  He was administered his first dose in the ER has been prescribed 7 days of antibiotics to take at home. Please follow-up this week with his primary care doctor, and return to the emergency department sooner if he is unable to urinate at home, becomes very difficult for him to pass urine, if he develops any fevers, chills, chest pain, shortness of breath, or any other new severe symptoms.

## 2020-07-03 NOTE — ED Triage Notes (Signed)
Pt here from home by EMS for c/o hematuria noted this morning. Per wife and EMS, it was "bright red". Pt denies any recent trauma, urological issues. Pt denies pmh of this before. Compliant with his medications which includes Eliquis. Denies CP, dyspnea, n/v/d. Denies abd pain, no distention or ttp.

## 2020-07-03 NOTE — ED Notes (Signed)
200 on bladder scan

## 2020-07-03 NOTE — ED Provider Notes (Signed)
[facility] EMERGENCY DEPARTMENT Provider Note   CSN: [id number] Arrival date & time: 07/03/20  1302     History Chief Complaint  Patient presents with  . Hematuria    Nathan Adams is a 87 y.o. male who presents with frank hematuria this morning, presented via EMS.  According to the patient and his wife was at the bedside he urinated bright red blood with some clots this morning.  He denies any recent trauma to his urethra/penis, denies any dysuria, urinary frequency or urgency.  He denies any past medical history of hematuria.  He is on Eliquis with history of CVAs.  Denies any abdominal pain or distention, denies any fevers or chills at home  I personally reviewed this patient's medical records.  He has history of bradycardia, anemia, clotting disorder, GERD, and CVAs.  He is on Eliquis for anticoagulation.  HPI     Past Medical History:  Diagnosis Date  . Anemia   . Clotting disorder (HCC)   . GERD (gastroesophageal reflux disease)   . Iron deficiency anemia 01/22/2018    Patient Active Problem List   Diagnosis Date Noted  . Iron deficiency anemia 01/22/2018    History reviewed. No pertinent surgical history.     History reviewed. No pertinent family history.  Social History   Tobacco Use  . Smoking status: Never Smoker  . Smokeless tobacco: Never Used  Vaping Use  . Vaping Use: Never used  Substance Use Topics  . Alcohol use: Never  . Drug use: Never    Home Medications Prior to Admission medications   Medication Sig Start Date End Date Taking? Authorizing Provider  acetaminophen (TYLENOL) 500 MG tablet Take 500 mg by mouth as needed for moderate pain.    [provider]  Cholecalciferol (VITAMIN D3) 2000 units TABS Take 1 tablet by mouth daily.    [provider]  ELIQUIS 5 MG TABS tablet Take 5 mg by mouth 2 (two) times daily.  11/02/17   [provider]  famotidine (PEPCID) 20 MG tablet Take 20 mg by mouth daily. 04/28/18    [provider]  tamsulosin (FLOMAX) 0.4 MG CAPS capsule Take 0.4 mg by mouth at bedtime.  11/02/17   [provider]    Allergies    Patient has no known allergies.  Review of Systems   Review of Systems  Constitutional: Negative.   HENT: Negative.   Respiratory: Negative.   Cardiovascular: Negative.   Gastrointestinal: Negative.   Genitourinary: Positive for hematuria. Negative for decreased urine volume, difficulty urinating, dysuria, flank pain, frequency, penile pain, penile swelling, scrotal swelling, testicular pain and urgency.  Neurological: Negative.     Physical Exam Updated Vital Signs BP (!) 187/66 (BP Location: Right Arm)   Pulse (!) 52   Temp 97.8 F (36.6 C) (Oral)   Resp 16   Ht 5\' 7"  (1.702 m)   SpO2 100%   BMI 27.25 kg/m   Physical Exam Vitals and nursing note reviewed. Exam conducted with a chaperone present.  Constitutional:      Appearance: Normal appearance. He is overweight. He is not toxic-appearing.  HENT:     Head: Normocephalic and atraumatic.     Nose: Nose normal.     Mouth/Throat:     Mouth: Mucous membranes are moist.     Pharynx: Oropharynx is clear. Uvula midline. No oropharyngeal exudate or posterior oropharyngeal erythema.     Tonsils: No tonsillar exudate.  Eyes:     General: Lids  are normal. Vision grossly intact.        Right eye: No discharge.        Left eye: No discharge.     Extraocular Movements: Extraocular movements intact.     Conjunctiva/sclera: Conjunctivae normal.     Pupils: Pupils are equal, round, and reactive to light.  Neck:     Trachea: Trachea and phonation normal.  Cardiovascular:     Rate and Rhythm: Normal rate and regular rhythm.     Pulses: Normal pulses.     Heart sounds: Normal heart sounds. No murmur heard.   Pulmonary:     Effort: Pulmonary effort is normal. No tachypnea, bradypnea, accessory muscle usage or respiratory distress.     Breath sounds: Normal breath sounds. No  wheezing or rales.  Chest:     Chest wall: No mass, lacerations, deformity, swelling, tenderness, crepitus or edema.  Abdominal:     General: Bowel sounds are normal. There is no distension.     Palpations: Abdomen is soft.     Tenderness: There is no abdominal tenderness. There is no right CVA tenderness, left CVA tenderness, guarding or rebound.  Genitourinary:    Penis: Circumcised. Discharge present.      Testes: Normal.     Epididymis:     Right: Normal.     Left: Normal.    Musculoskeletal:        General: No deformity.     Cervical back: Full passive range of motion without pain and neck supple. No edema, rigidity or crepitus. No pain with movement, spinous process tenderness or muscular tenderness.     Right lower leg: No edema.     Left lower leg: No edema.  Lymphadenopathy:     Cervical: No cervical adenopathy.  Skin:    General: Skin is warm and dry.  Neurological:     Mental Status: He is alert. Mental status is at baseline.     Cranial Nerves: Cranial nerves are intact.     Sensory: Sensation is intact.     Motor: Motor function is intact.     Gait: Gait is intact.  Psychiatric:        Mood and Affect: Mood normal.     ED Results / Procedures / Treatments   Labs (all labs ordered are listed, but only abnormal results are displayed) Labs Reviewed  URINE CULTURE  URINALYSIS, ROUTINE W REFLEX MICROSCOPIC  CBC WITH DIFFERENTIAL/PLATELET  BASIC METABOLIC PANEL    EKG EKG Interpretation  Date/Time:  Sunday July 03 2020 13:18:07 EDT Ventricular Rate:  46 PR Interval:  218 QRS Duration: 93 QT Interval:  465 QTC Calculation: 407 R Axis:   -12 Text Interpretation: Sinus bradycardia Atrial premature complexes in couplets Confirmed by Miller, Brian ([id number]) on 07/03/2020 1:22:43 PM   Radiology No results found.  Procedures Procedures  Medications Ordered in ED Medications - No data to display  ED Course  I have reviewed the triage vital signs and the  nursing notes.  Pertinent labs & imaging results that were available during my care of the patient were reviewed by me and considered in my medical decision making (see chart for details).    MDM Rules/Calculators/A&P                          87  year old male presents to the emergency department for for hematuria without other urinary symptoms, fevers, chills, or abdominal pain.  No history of the same prior.  He is anticoagulated on Eliquis.  Differential diagnosis for this patient symptoms includes but is not limited to UTI, nephrolithiasis, neoplasm, BPH, blunt trauma, prostatitis, urethral stricture, glomerulonephritis, coagulopathy, pharmacologic anticoagulation.  Bradycardic which is his baseline, hypertensive.  Vital signs otherwise normal.  Cardiopulmonary exam is normal, abdominal exam is benign.  GU exam revealed dried crusted blood on the penile head and frank bright red blood within the urethral opening.  There is no tenderness to palpation of the testes bilaterally, no swelling or discoloration either.  Basic laboratory studies revealed Anemia with hemoglobin of 10.8, near patient baseline of 11.  BMP is unremarkable, UA with moderate hemoglobin, protein, and greater than 50 RBCs and WBCs.  Patient able to freely give urine specimen from the emergency department, with output of approximately 100 cc.  RN did note small blood clots in the urine specimen.  Will perform bladder scan, if less than 200 cc are present, will discharge with antibiotic therapy for presumed UTI vs isolated pharmacologic anticoagulation etiology.  If greater than 200 cc will consider insertion of Foley catheter for bladder irrigation.  Bladder scan revealed approximately 200 cc in the bladder, patient able to urinate again spontaneously at the bedside with passage of small clots.  Given less than 200 cc of urine in the bladder and patient ability to continue to urinate, will not proceed with bladder irrigation at  this time.  Will discharge on course of antibiotics for potential urinary tract infection and have given strict return precautions.  Eileen and his wife who was at the bedside voiced understanding of his medical evaluation and treatment plan.  Each of their questions was answered to their expressed satisfaction.  Return precautions given.  Patient is well-appearing, stable, and appropriate for discharge.   This chart was dictated using voice recognition software, Dragon. Despite the best efforts of this provider to proofread and correct errors, errors may still occur which can change documentation meaning.  Final Clinical Impression(s) / ED Diagnoses Final diagnoses:  None    Rx / DC Orders ED Discharge Orders    None       Sherrilee Gilles 07/04/20 0033    Eber Hong, MD 07/04/20 [phone]

## 2020-07-03 NOTE — ED Notes (Signed)
Pt's HR noted 36-42 upon arrival to room. Pt awake, verbal, oriented to location with some mild confusion to events. States his wife helps care for him and has more information than he remembers. EMS reports he was bradycardic in the 40's en route with some noted perfusing unifocal PVC's. Pt denies feeling lightheaded or weak.

## 2020-07-05 LAB — URINE CULTURE: Culture: NO GROWTH

## 2020-08-22 ENCOUNTER — Emergency Department (HOSPITAL_COMMUNITY)
Admission: EM | Admit: 2020-08-22 | Discharge: 2020-08-22 | Disposition: A | Discharge status: Home / Self Care | Payer: Medicare HMO | Attending: Emergency Medicine | Admitting: Emergency Medicine

## 2020-08-22 ENCOUNTER — Emergency Department (HOSPITAL_COMMUNITY): Payer: Medicare HMO

## 2020-08-22 ENCOUNTER — Other Ambulatory Visit: Payer: Self-pay

## 2020-08-22 ENCOUNTER — Encounter (HOSPITAL_COMMUNITY): Payer: Self-pay

## 2020-08-22 DIAGNOSIS — W109XXA Fall (on) (from) unspecified stairs and steps, initial encounter: Secondary | ICD-10-CM | POA: Insufficient documentation

## 2020-08-22 DIAGNOSIS — S0990XA Unspecified injury of head, initial encounter: Secondary | ICD-10-CM | POA: Diagnosis present

## 2020-08-22 DIAGNOSIS — S01511A Laceration without foreign body of lip, initial encounter: Secondary | ICD-10-CM | POA: Insufficient documentation

## 2020-08-22 DIAGNOSIS — M542 Cervicalgia: Secondary | ICD-10-CM | POA: Diagnosis not present

## 2020-08-22 DIAGNOSIS — S39012A Strain of muscle, fascia and tendon of lower back, initial encounter: Secondary | ICD-10-CM | POA: Insufficient documentation

## 2020-08-22 DIAGNOSIS — S060X0A Concussion without loss of consciousness, initial encounter: Secondary | ICD-10-CM | POA: Insufficient documentation

## 2020-08-22 DIAGNOSIS — W19XXXA Unspecified fall, initial encounter: Secondary | ICD-10-CM

## 2020-08-22 HISTORY — DX: Vitamin D deficiency, unspecified: E55.9

## 2020-08-22 HISTORY — DX: Paroxysmal atrial fibrillation: I48.0

## 2020-08-22 HISTORY — DX: Pure hypercholesterolemia, unspecified: E78.00

## 2020-08-22 HISTORY — DX: Benign prostatic hyperplasia without lower urinary tract symptoms: N40.0

## 2020-08-22 HISTORY — DX: Transient cerebral ischemic attack, unspecified: G45.9

## 2020-08-22 LAB — BASIC METABOLIC PANEL
Anion gap: 7 (ref 5–15)
BUN: 33 mg/dL — ABNORMAL HIGH (ref 8–23)
CO2: 26 mmol/L (ref 22–32)
Calcium: 9 mg/dL (ref 8.9–10.3)
Chloride: 104 mmol/L (ref 98–111)
Creatinine, Ser: 1.05 mg/dL (ref 0.61–1.24)
GFR, Estimated: >60 mL/min (ref >60)
Glucose, Bld: 105 mg/dL — ABNORMAL HIGH (ref 70–99)
Potassium: 4.2 mmol/L (ref 3.5–5.1)
Sodium: 137 mmol/L (ref 135–145)

## 2020-08-22 LAB — CBC WITH DIFFERENTIAL/PLATELET
Abs Immature Granulocytes: 0.02 10*3/uL (ref 0.00–0.07)
Basophils Absolute: 0 10*3/uL (ref 0.0–0.1)
Basophils Relative: 0 %
Eosinophils Absolute: 0 10*3/uL (ref 0.0–0.5)
Eosinophils Relative: 1 %
HCT: 34.5 % — ABNORMAL LOW (ref 39.0–52.0)
Hemoglobin: 11.5 g/dL — ABNORMAL LOW (ref 13.0–17.0)
Immature Granulocytes: 0 %
Lymphocytes Relative: 9 %
Lymphs Abs: 0.6 10*3/uL — ABNORMAL LOW (ref 0.7–4.0)
MCH: 32.5 pg (ref 26.0–34.0)
MCHC: 33.3 g/dL (ref 30.0–36.0)
MCV: 97.5 fL (ref 80.0–100.0)
Monocytes Absolute: 0.6 10*3/uL (ref 0.1–1.0)
Monocytes Relative: 10 %
Neutro Abs: 5.1 10*3/uL (ref 1.7–7.7)
Neutrophils Relative %: 80 %
Platelets: 92 10*3/uL — ABNORMAL LOW (ref 150–400)
RBC: 3.54 MIL/uL — ABNORMAL LOW (ref 4.22–5.81)
RDW: 13.2 % (ref 11.5–15.5)
WBC: 6.4 10*3/uL (ref 4.0–10.5)
nRBC: 0 % (ref 0.0–0.2)

## 2020-08-22 NOTE — ED Provider Notes (Signed)
CBC is baseline.  X-ray of the femur without any bony abnormalities.  Dr. Bebe Shaggy has contacted social worker to arrange assistance with home devices to include walker.   Vanetta Mulders, MD 08/22/20 [phone]

## 2020-08-22 NOTE — Discharge Instructions (Signed)
Dr. Bebe Shaggy has arranged for social worker to assist with walker at home.  X-rays labs here today without any acute findings.  Stable for discharge home.

## 2020-08-22 NOTE — ED Triage Notes (Signed)
Pt fell down approximately 8 basement steps. Denies LOC. Pt with laceration to bottom lip.

## 2020-08-22 NOTE — ED Provider Notes (Signed)
[facility] EMERGENCY DEPARTMENT Provider Note   CSN: [id number] Arrival date & time: 08/22/20  0457     History Chief Complaint  Patient presents with  . Fall    Nathan Adams is a 87 y.o. male.  The history is provided by the patient.  Fall This is a new problem. The problem occurs constantly. The problem has not changed since onset.Pertinent negatives include no chest pain, no abdominal pain and no headaches. Nothing aggravates the symptoms. Nothing relieves the symptoms.  Reported the patient fell down approximately 8 steps.  No LOC is reported.  Patient did sustain small laceration to his lower lip.  Patient reports he has pain in his neck and back     Past Medical History:  Diagnosis Date  . Anemia   . BPH (benign prostatic hyperplasia)   . Clotting disorder (HCC)   . CVA (cerebral vascular accident) (HCC) 2013  . GERD (gastroesophageal reflux disease)   . Hypercholesterolemia   . Iron deficiency anemia 01/22/2018  . Paroxysmal atrial fibrillation (HCC)   . TIA (transient ischemic attack)   . Vitamin D deficiency     Patient Active Problem List   Diagnosis Date Noted  . Iron deficiency anemia 01/22/2018    History reviewed. No pertinent surgical history.     History reviewed. No pertinent family history.  Social History   Tobacco Use  . Smoking status: Never Smoker  . Smokeless tobacco: Never Used  Vaping Use  . Vaping Use: Never used  Substance Use Topics  . Alcohol use: Never  . Drug use: Never    Home Medications Prior to Admission medications   Medication Sig Start Date End Date Taking? Authorizing Provider  acetaminophen (TYLENOL) 500 MG tablet Take 500 mg by mouth as needed for moderate pain.    [provider]  Cholecalciferol (VITAMIN D3) 2000 units TABS Take 1 tablet by mouth daily.    [provider]  ELIQUIS 5 MG TABS tablet Take 5 mg by mouth 2 (two) times daily.  11/02/17   [provider]  famotidine (PEPCID)  20 MG tablet Take 20 mg by mouth daily. 04/28/18   [provider]  tamsulosin (FLOMAX) 0.4 MG CAPS capsule Take 0.4 mg by mouth at bedtime.  11/02/17   [provider]    Allergies    Patient has no known allergies.  Review of Systems   Review of Systems  Cardiovascular: Negative for chest pain.  Gastrointestinal: Negative for abdominal pain.  Musculoskeletal: Positive for back pain and neck pain.  Skin: Positive for wound.  Neurological: Negative for headaches.  All other systems reviewed and are negative.   Physical Exam Updated Vital Signs BP (!) 157/82 (BP Location: Left Arm)   Pulse (!) 47   Temp 98.1 F (36.7 C) (Oral)   Resp (!) 26   Ht 1.702 m (5\' 7" )   Wt 79.8 kg   SpO2 98%   BMI 27.57 kg/m   Physical Exam CONSTITUTIONAL: Elderly, no acute distress HEAD: Normocephalic/atraumatic EYES: EOMI/PERRL, no proptosis ENMT: Mucous membranes moist, small superficial laceration to the mucosa of the lower lip.  Does not cross vermilion border.  No other signs of any nasal/dental injury.  No facial tenderness or crepitus. SPINE/BACK kyphotic spine.  Patient with mild cervical spine tenderness.  Lumbar tenderness noted.  Previous spinal surgery scars noted.  No thoracic tenderness CV: S1/S2 noted, no loud harsh murmurs LUNGS: Lungs are clear to auscultation bilaterally, no apparent distress Chest-no deformities  or crepitus ABDOMEN: soft, nontender, no rebound or guarding, bowel sounds noted throughout abdomen, no bruising NEURO: Pt is awake/alert/appropriate, left-sided paresis is noted likely chronic EXTREMITIES: pulses normal/equal, full ROM, pelvis stable All other extremities/joints palpated/ranged and nontender SKIN: warm, color normal PSYCH: no abnormalities of mood noted, alert and oriented to situation  ED Results / Procedures / Treatments   Labs (all labs ordered are listed, but only abnormal results are displayed) Labs Reviewed  BASIC METABOLIC  PANEL - Abnormal; Notable for the following components:      Result Value   Glucose, Bld 105 (*)    BUN 33 (*)    All other components within normal limits  CBC WITH DIFFERENTIAL/PLATELET - Abnormal; Notable for the following components:   RBC 3.54 (*)    Hemoglobin 11.5 (*)    HCT 34.5 (*)    All other components within normal limits    EKG EKG Interpretation  Date/Time:  Monday Aug 22 2020 05:08:43 EDT Ventricular Rate:  53 PR Interval:  215 QRS Duration: 111 QT Interval:  431 QTC Calculation: 405 R Axis:   -6 Text Interpretation: Sinus rhythm Atrial premature complexes Borderline prolonged PR interval Interpretation limited secondary to artifact No significant change since last tracing Confirmed by Zadie Rhine ([id number]) on 08/22/2020 5:27:49 AM   Radiology DG Lumbar Spine Complete  Result Date: 08/22/2020 CLINICAL DATA:  87 year old male status post fall down steps. EXAM: LUMBAR SPINE - COMPLETE 4+ VIEW COMPARISON:  None. FINDINGS: Normal lumbar segmentation. Grade 1 anterolisthesis of L5 on S1. Subtle anterolisthesis of L4 on L5. Straightening of lumbar lordosis elsewhere with mild levoconvex scoliosis superimposed. Bone mineralization is within normal limits for age. No pars fracture. No acute osseous abnormality identified. Diffuse advanced spinal disc and endplate degeneration. Negative visible bowel gas pattern. Pelvic vascular calcifications. IMPRESSION: 1. No acute osseous abnormality identified in the lumbar spine. 2. Diffuse disc and endplate degeneration. Mild spondylolisthesis at L4-L5 and L5-S1. Electronically Signed   By: Odessa Fleming M.D.   On: 08/22/2020 06:23   CT Head Wo Contrast  Result Date: 08/22/2020 CLINICAL DATA:  87 year old male status post fall down steps. EXAM: CT HEAD WITHOUT CONTRAST TECHNIQUE: Contiguous axial images were obtained from the base of the skull through the vertex without intravenous contrast. COMPARISON:  None. FINDINGS: Brain: No acute  intracranial hemorrhage identified. No midline shift, mass effect, or evidence of intracranial mass lesion. No ventriculomegaly. Pronounced chronic ischemic and small vessel disease with chronic appearing infarcts in the right thalamus, left parietal lobe, right cerebellum, and confluent bilateral white matter hypodensity. Small dystrophic calcification suspected at the right basal ganglia. No acute cortically based infarct identified. Vascular: Calcified atherosclerosis at the skull base. No suspicious intracranial vascular hyperdensity. Skull: No fracture identified. Sinuses/Orbits: Visualized paranasal sinuses and mastoids are clear. Other: Right forehead small broad-based scalp hematoma. No scalp soft tissue gas. No orbits soft tissue injury identified. IMPRESSION: 1. Right forehead scalp hematoma without underlying skull fracture. 2. No acute traumatic injury to the brain identified. Advanced chronic ischemic disease. Electronically Signed   By: Odessa Fleming M.D.   On: 08/22/2020 06:16   CT Cervical Spine Wo Contrast  Result Date: 08/22/2020 CLINICAL DATA:  87 year old male status post fall down steps. EXAM: CT CERVICAL SPINE WITHOUT CONTRAST TECHNIQUE: Multidetector CT imaging of the cervical spine was performed without intravenous contrast. Multiplanar CT image reconstructions were also generated. COMPARISON:  Head CT today reported separately. FINDINGS: Alignment: Straightening of cervical lordosis. Satisfactory cervicothoracic junction alignment,  with ankylosis at that level. Chronic posterior cervical decompression. Skull base and vertebrae: Visualized skull base is intact. No atlanto-occipital dissociation. C1 and C2 appear intact and normally aligned although with very severe left side chronic C1-C2 joint degeneration. C2-C3 ankylosis. C3 through C6 prior posterior decompression. C5-C6 ankylosis. C7-T1 ankylosis. No acute osseous abnormality identified. Soft tissues and spinal canal: No prevertebral fluid  or swelling. No visible canal hematoma. Bulky calcified carotid atherosclerosis with a retropharyngeal course of the right carotid. Disc levels: Intermittent degenerative changes and ankylosis as above status post C3 through C6 posterior decompression. Advanced facet, disc and endplate degeneration at the levels which are not ankylosed. Upper chest: Grossly intact visible upper thoracic levels. Negative lung apices. Severe bilateral sternoclavicular joint degeneration. IMPRESSION: 1. No acute traumatic injury identified in the cervical spine. 2. Severe cervical spine degeneration with intermittent cervical spine ankylosis, prior posterior decompression C3 through C6. Electronically Signed   By: Odessa Fleming M.D.   On: 08/22/2020 06:19   DG Chest Port 1 View  Result Date: 08/22/2020 CLINICAL DATA:  87 year old male status post fall down steps. EXAM: PORTABLE CHEST 1 VIEW COMPARISON:  None. FINDINGS: Portable AP supine view at 0521 hours. Cardiomegaly. Other mediastinal contours are within normal limits. Visualized tracheal air column is within normal limits. Normal lung volumes. Allowing for portable technique the lungs are clear. No pneumothorax or pleural effusion evident on this supine view. Chronic endplate degeneration in the spine. Advanced chronic degeneration at the left shoulder. No acute osseous abnormality identified. IMPRESSION: No acute cardiopulmonary abnormality or acute traumatic injury identified. Mild cardiomegaly. Electronically Signed   By: Odessa Fleming M.D.   On: 08/22/2020 06:21    Procedures Procedures   Medications Ordered in ED Medications - No data to display  ED Course  I have reviewed the triage vital signs and the nursing notes.  Pertinent labs & imaging results that were available during my care of the patient were reviewed by me and considered in my medical decision making (see chart for details).    MDM Rules/Calculators/A&P                          6:31 AM Pt presents  after what sounds like a mechanical fall He is awake/alert Imaging results do not reveal any acute injury Labs pending Family is on the way to the hospital 7:21 AM Wife is arrived and reports patient's walker did break which caused him to fall.  He was likely up using the bathroom.  He fell down several steps. Imaging is negative for acute process.  On repeat assessment, patient is reporting left thigh pain but there is no deformities. Patient will also need a new walker. At signout to Dr. Deretha Emory, follow-up on x-ray. If negative and he can bear weight he will likely be discharged Transition of care consult has been placed to assist with home health and a walker Final Clinical Impression(s) / ED Diagnoses Final diagnoses:  Fall, initial encounter  Back strain, initial encounter  Lip laceration, initial encounter  Concussion without loss of consciousness, initial encounter    Rx / DC Orders ED Discharge Orders    None       Zadie Rhine, MD 08/22/20 [phone]

## 2020-08-22 NOTE — Progress Notes (Signed)
TOC consulted for [facility] and DME needs. Pt is in need of a walker as his is broken. CSW spoke with family and they are agreeable to [employer] supplying the walker. CSW had pts wife sign the delivery ticket and CSW provided walker to pt in the room. CSW spoke to Sagar with [facility] who is able to accept pts referral for [facility]. Pt and family agreeable to this referral. CSW updated ED staff. CSW signing off.

## 2022-03-29 IMAGING — CT CT CERVICAL SPINE W/O CM
2 of 3 series · 10 of 27 positions shown, 13 images · non-contrast
Comparison: Head CT today reported separately.

CLINICAL DATA: [AGE] male status post fall down steps.

EXAM:
CT CERVICAL SPINE WITHOUT CONTRAST
TECHNIQUE: Multidetector CT imaging of the cervical spine was performed without
intravenous contrast. Multiplanar CT image reconstructions were also
generated.

[Series 5: sagittal bone · sagittal · 0.23mm/px · 5 of 61 slices shown, 6 images]
[im 21/61  bone]
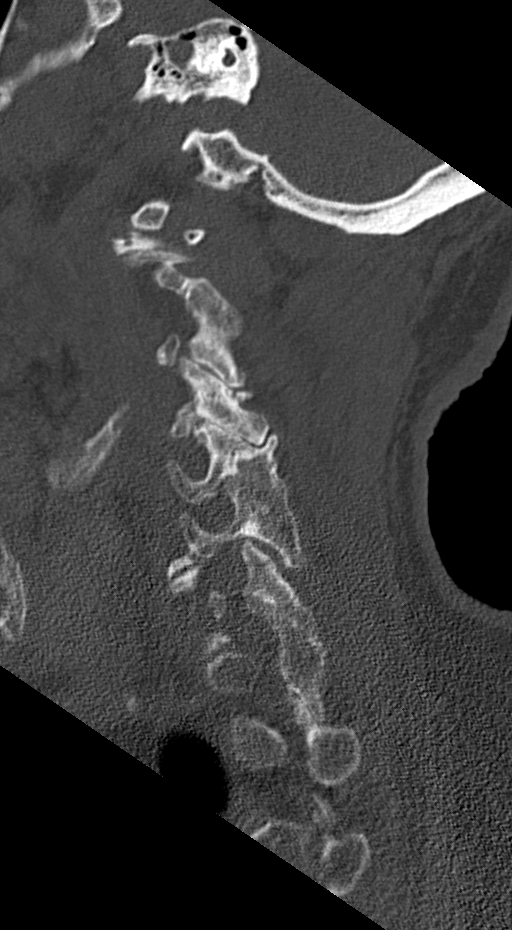
[im 26/61  bone]
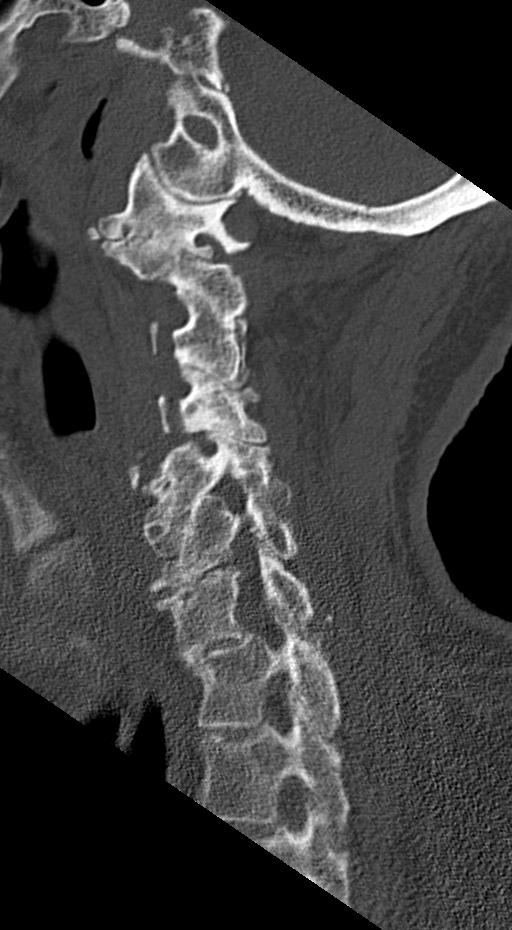
[im 31/61  soft-tissue]
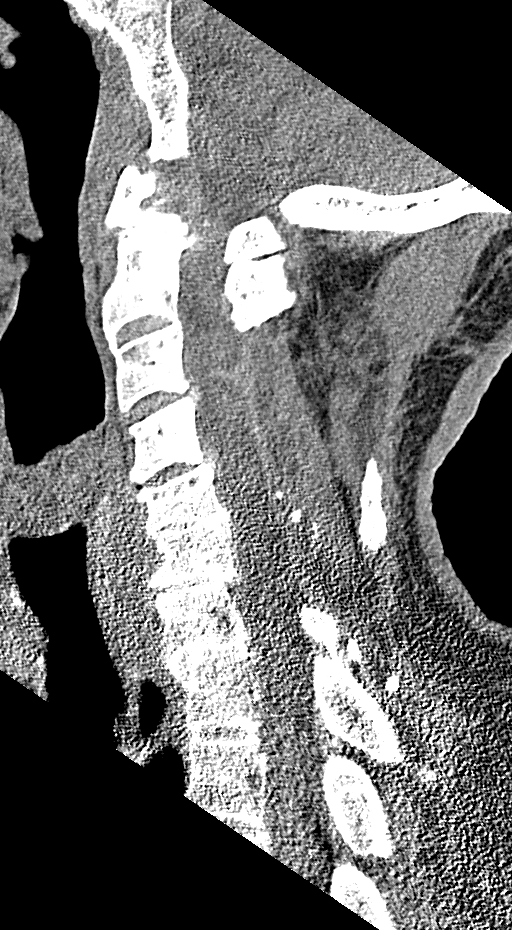
[im 31/61  bone]
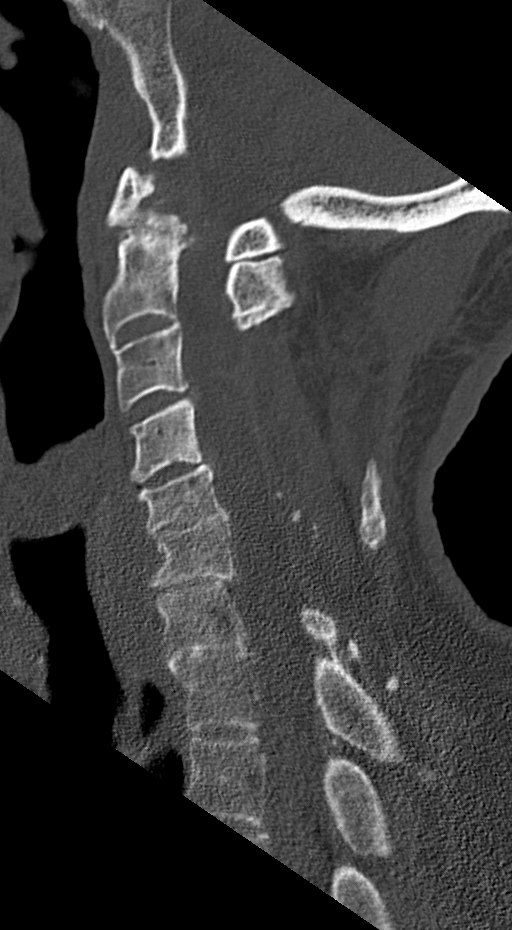
[im 36/61  bone]
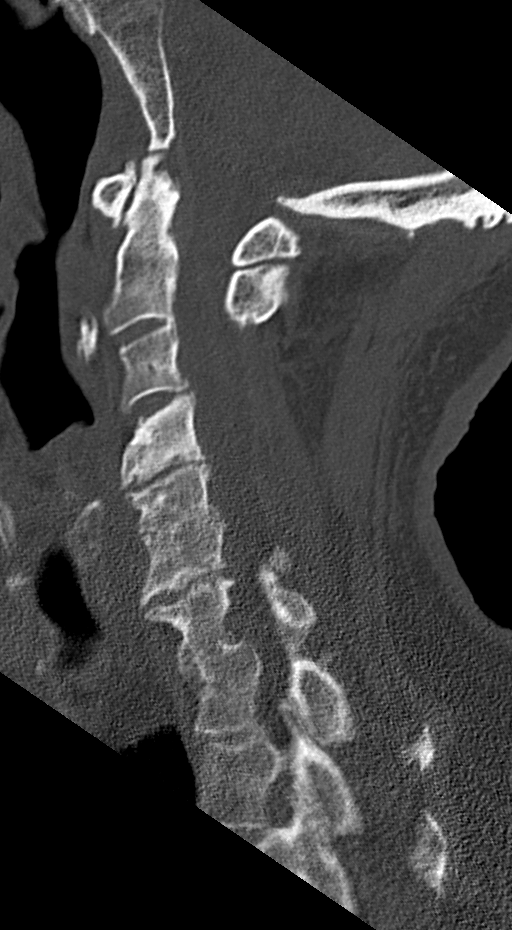
[im 41/61  bone]
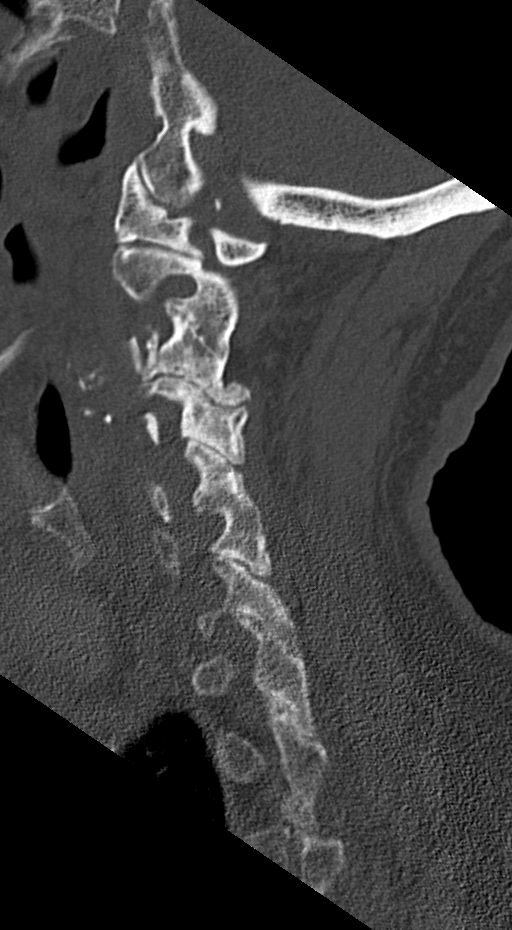

[Series 7: orthogonal axials · axial · 0.21mm/px · z∈[-183,-111]mm · 5 of 87 slices shown, 7 images]
[im 15/87  soft-tissue]
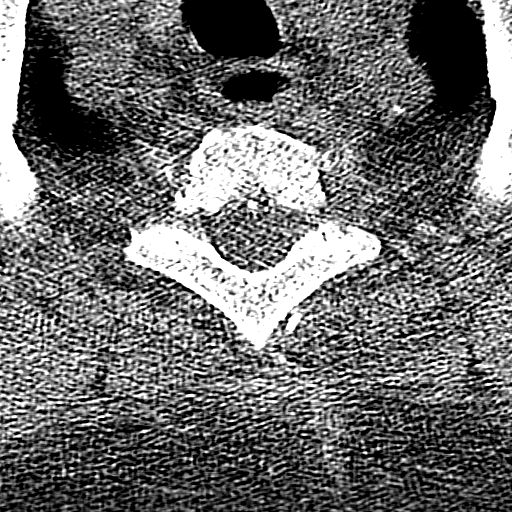
[im 15/87  bone]
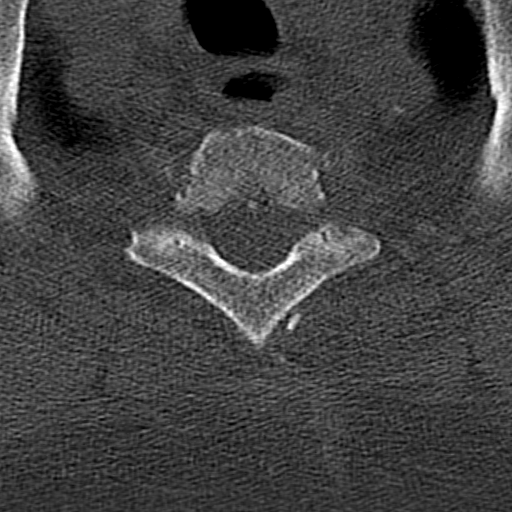
[im 29/87  bone]
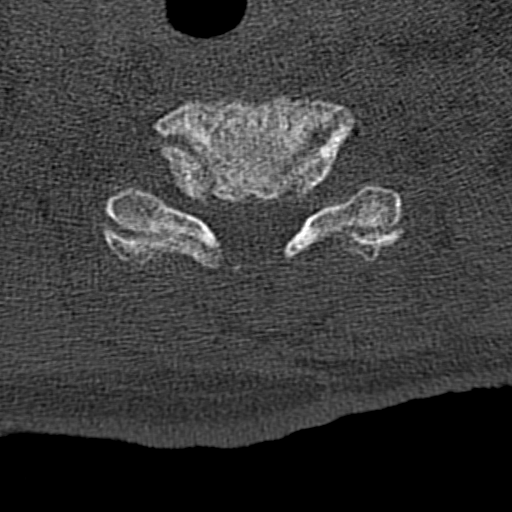
[im 44/87  bone]
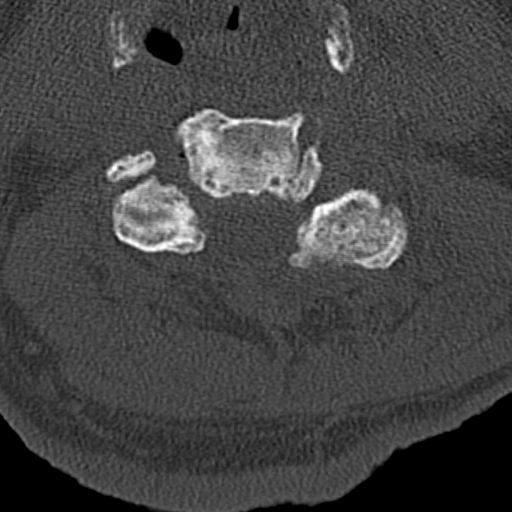
[im 58/87  bone]
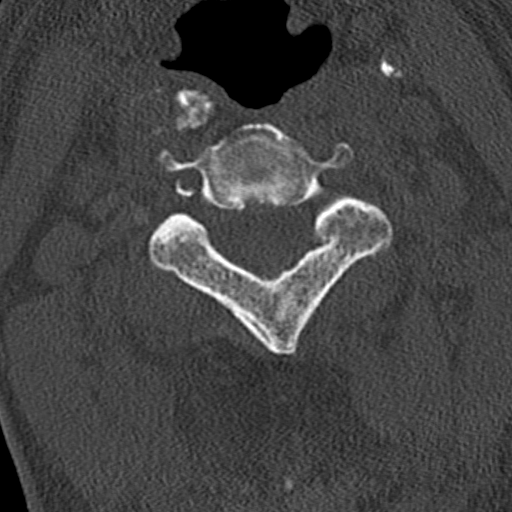
[im 72/87  soft-tissue]
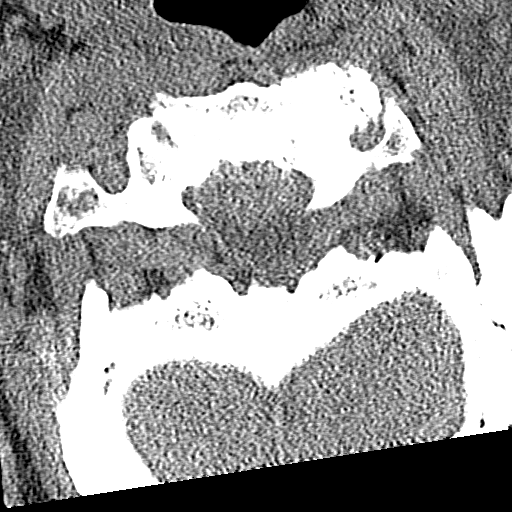
[im 72/87  bone]
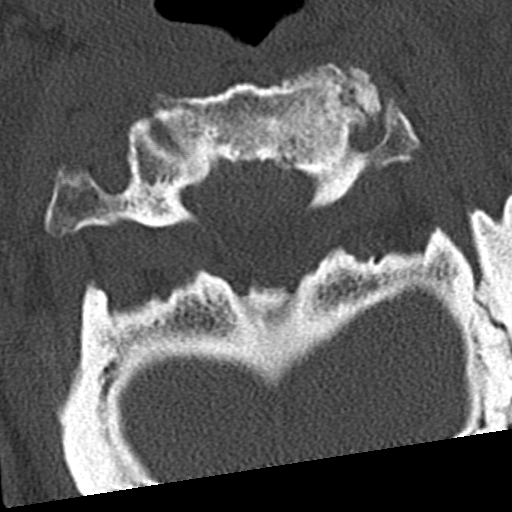

[10 of 27 positions shown; findings below may reference images not displayed]

FINDINGS: Alignment: Straightening of cervical lordosis. Satisfactory
cervicothoracic junction alignment, with ankylosis at that level.
Chronic posterior cervical decompression.

Skull base and vertebrae: Visualized skull base is intact. No
atlanto-occipital dissociation. C1 and C2 appear intact and normally
aligned although with very severe left side chronic C1-C2 joint
degeneration.

C2-C3 ankylosis.

C3 through C6 prior posterior decompression.

C5-C6 ankylosis.

C7-T1 ankylosis.

No acute osseous abnormality identified.

Soft tissues and spinal canal: No prevertebral fluid or swelling. No
visible canal hematoma. Bulky calcified carotid atherosclerosis with
a retropharyngeal course of the right carotid.

Disc levels: Intermittent degenerative changes and ankylosis as
above status post C3 through C6 posterior decompression. Advanced
facet, disc and endplate degeneration at the levels which are not
ankylosed.

Upper chest: Grossly intact visible upper thoracic levels. Negative
lung apices. Severe bilateral sternoclavicular joint degeneration.
IMPRESSION: 1. No acute traumatic injury identified in the cervical spine.
2. Severe cervical spine degeneration with intermittent cervical
spine ankylosis, prior posterior decompression C3 through C6.

## 2022-03-29 IMAGING — CT CT HEAD W/O CM
3 series · 15 of 47 positions shown, 18 images · non-contrast
Comparison: None.

CLINICAL DATA: [AGE] male status post fall down steps.

EXAM:
CT HEAD WITHOUT CONTRAST
TECHNIQUE: Contiguous axial images were obtained from the base of the skull
through the vertex without intravenous contrast.

[Series 2: head w o · axial · 0.42mm/px · z∈[-42,+88]mm · 9 of 32 slices shown, 12 images]
[im 3/32  brain]
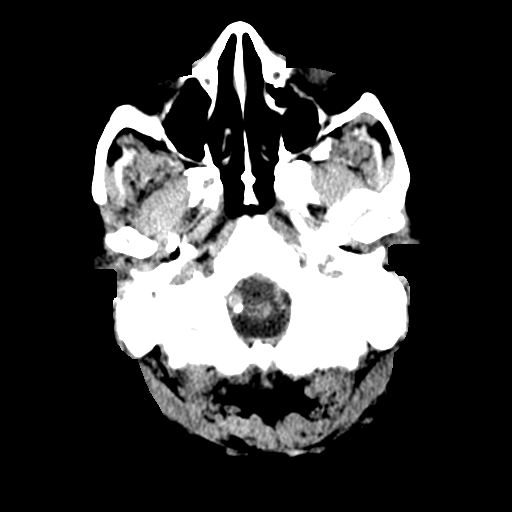
[im 3/32  bone]
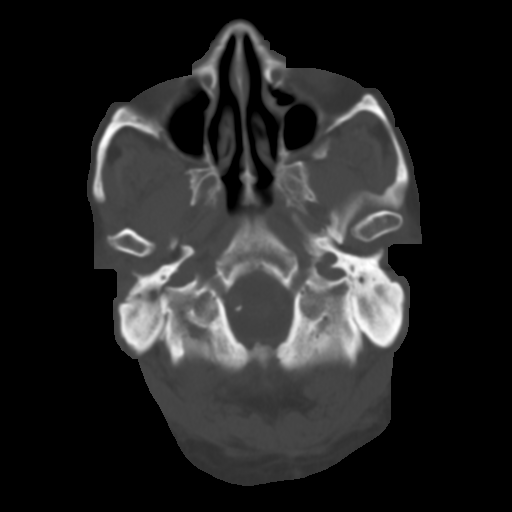
[im 6/32  brain]
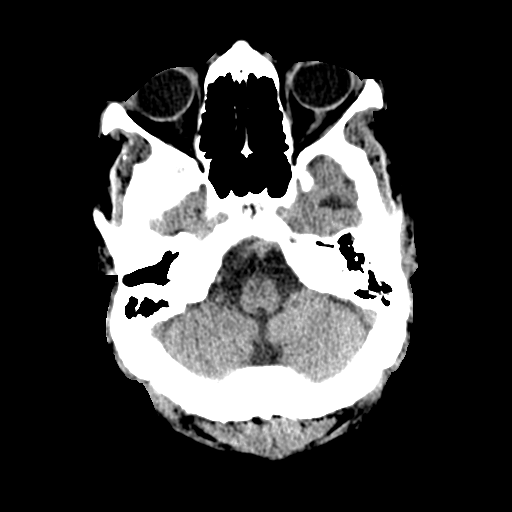
[im 9/32  brain]
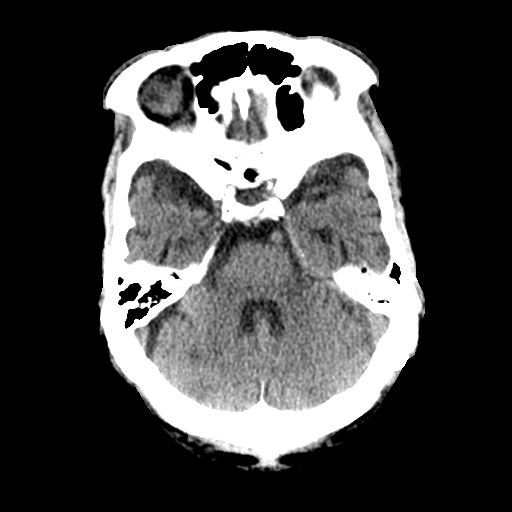
[im 12/32  brain]
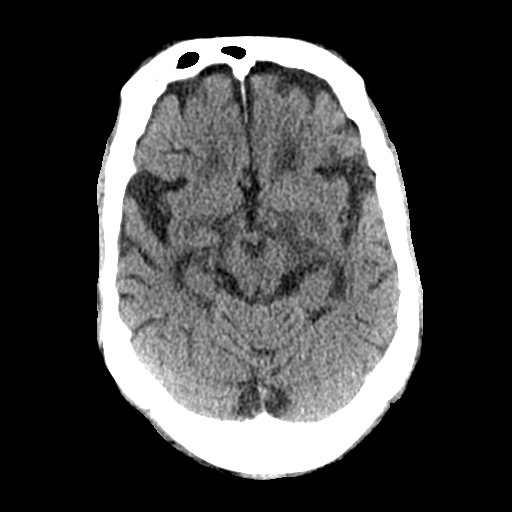
[im 17/32  brain]
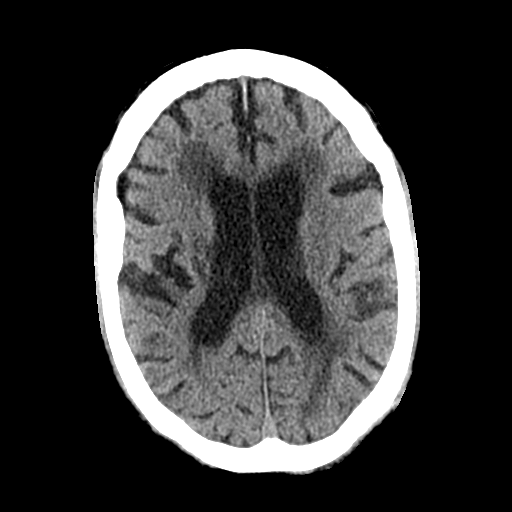
[im 17/32  bone]
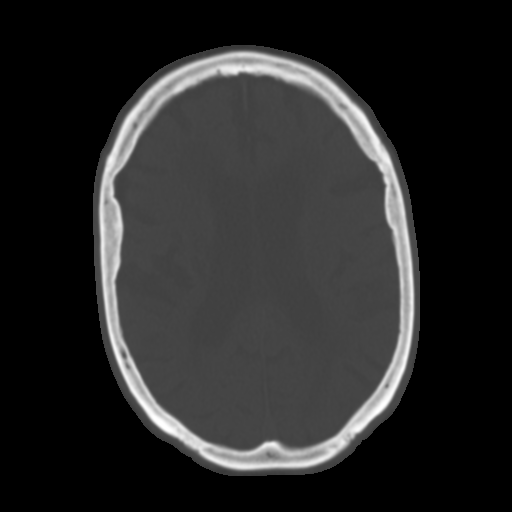
[im 20/32  brain]
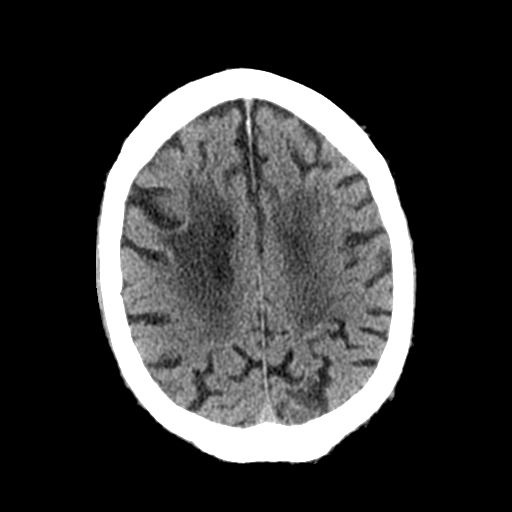
[im 23/32  brain]
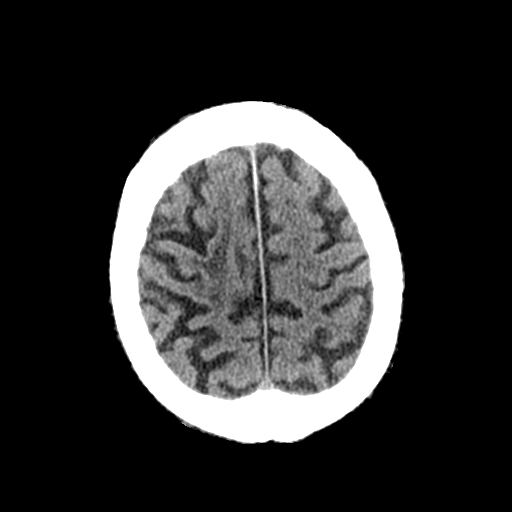
[im 26/32  brain]
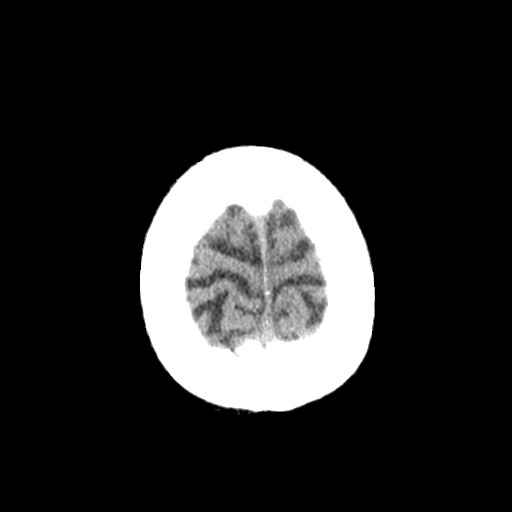
[im 29/32  brain]
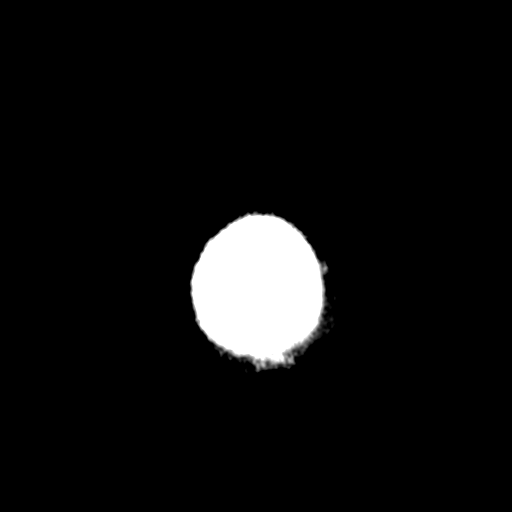
[im 29/32  bone]
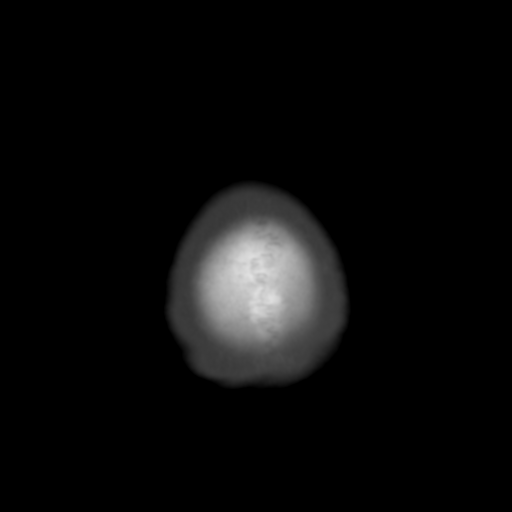

[Series 4: coronal soft · coronal · 0.34mm/px · 3 of 70 slices shown]
[im 24/70  brain]
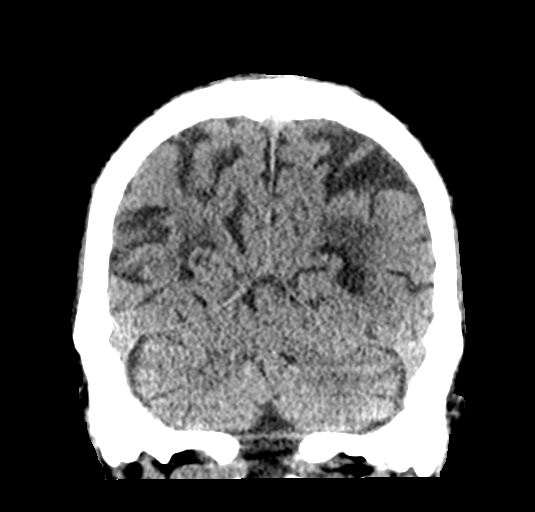
[im 31/70  brain]
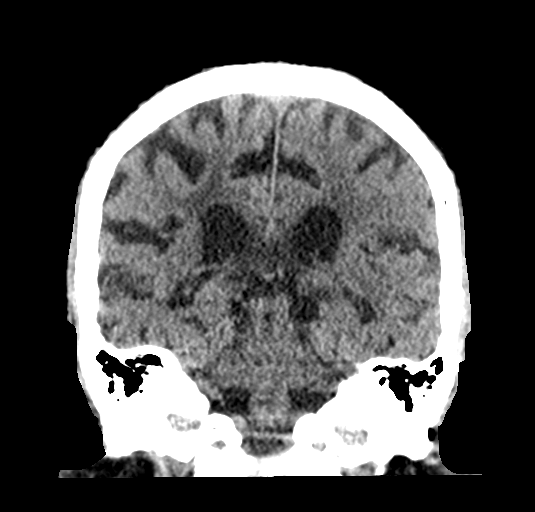
[im 39/70  brain]
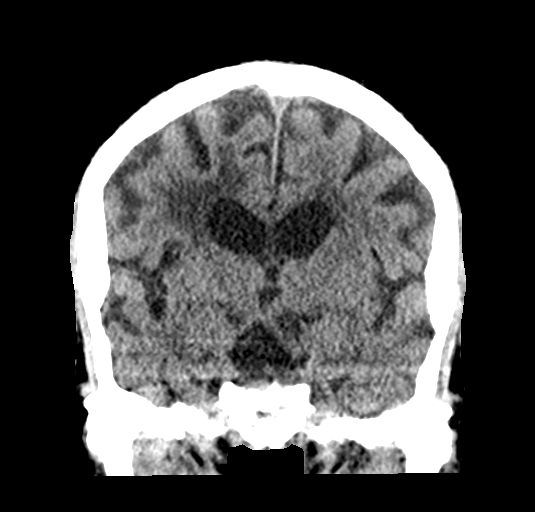

[Series 5: sagittal soft · sagittal · 0.31mm/px · 3 of 64 slices shown]
[im 22/64  brain]
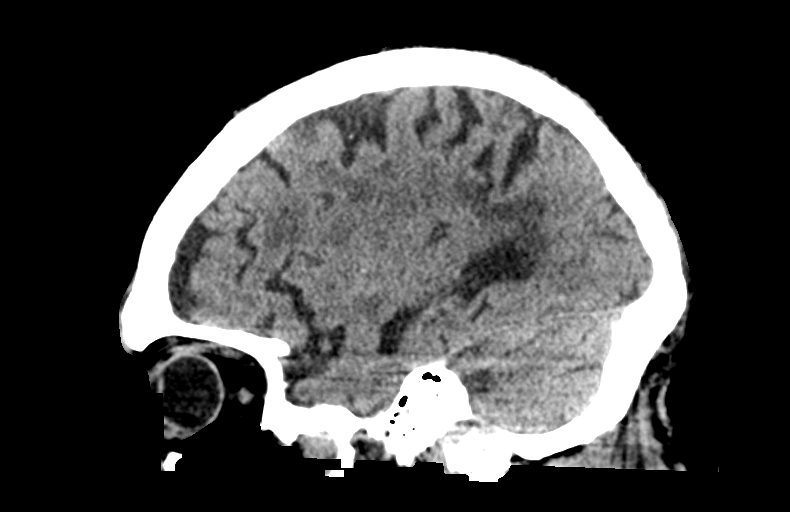
[im 32/64  brain]
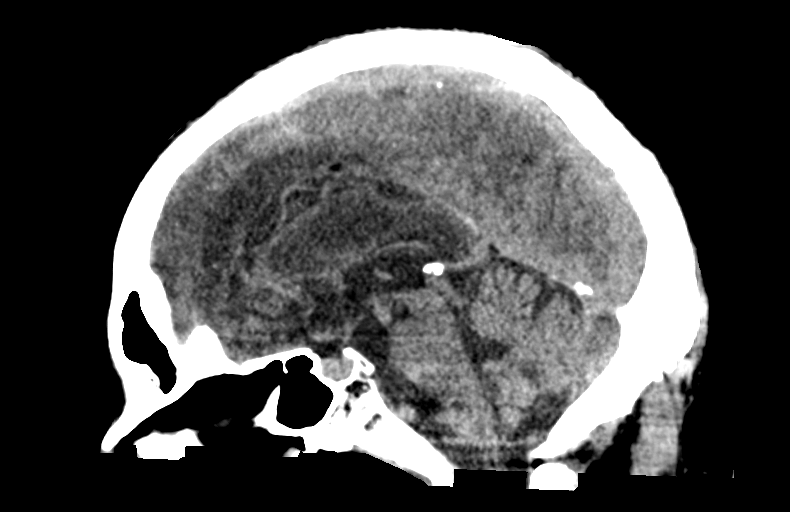
[im 43/64  brain]
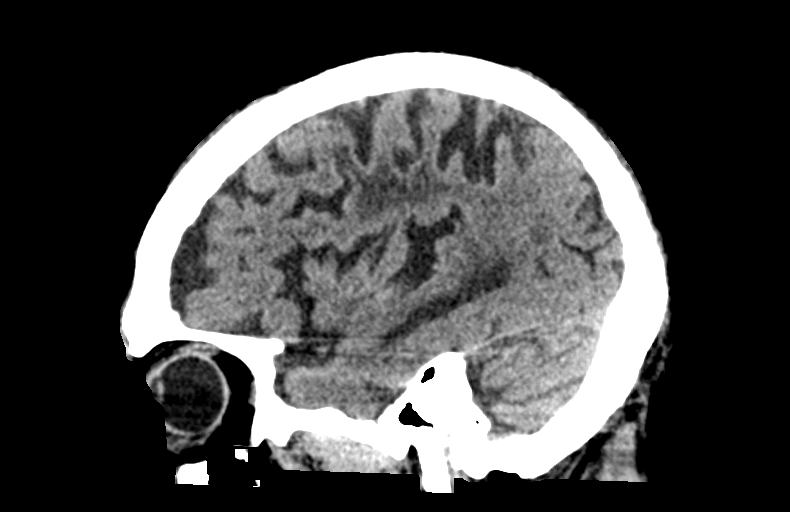

[15 of 47 positions shown; findings below may reference images not displayed]

FINDINGS: Brain: No acute intracranial hemorrhage identified. No midline
shift, mass effect, or evidence of intracranial mass lesion. No
ventriculomegaly.

Pronounced chronic ischemic and small vessel disease with chronic
appearing infarcts in the right thalamus, left parietal lobe, right
cerebellum, and confluent bilateral white matter hypodensity. Small
dystrophic calcification suspected at the right basal ganglia. No
acute cortically based infarct identified.

Vascular: Calcified atherosclerosis at the skull base. No suspicious
intracranial vascular hyperdensity.

Skull: No fracture identified.

Sinuses/Orbits: Visualized paranasal sinuses and mastoids are clear.

Other: Right forehead small broad-based scalp hematoma. No scalp
soft tissue gas. No orbits soft tissue injury identified.
IMPRESSION: 1. Right forehead scalp hematoma without underlying skull fracture.
2. No acute traumatic injury to the brain identified. Advanced
chronic ischemic disease.
